# Patient Record
Sex: Female | Born: 1955
Health system: Southern US, Community
[De-identification: ages and names within clinical notes are randomized; demographics above are authoritative.]

## PROBLEM LIST (undated history)

## (undated) DIAGNOSIS — B029 Zoster without complications: Secondary | ICD-10-CM

## (undated) DIAGNOSIS — E782 Mixed hyperlipidemia: Secondary | ICD-10-CM

## (undated) DIAGNOSIS — L0390 Cellulitis, unspecified: Secondary | ICD-10-CM

## (undated) DIAGNOSIS — F5101 Primary insomnia: Secondary | ICD-10-CM

## (undated) DIAGNOSIS — M1990 Unspecified osteoarthritis, unspecified site: Secondary | ICD-10-CM

## (undated) DIAGNOSIS — F419 Anxiety disorder, unspecified: Secondary | ICD-10-CM

## (undated) HISTORY — DX: Primary insomnia: F51.01

## (undated) HISTORY — PX: OTHER SURGICAL HISTORY: SHX169

## (undated) HISTORY — DX: Mixed hyperlipidemia: E78.2

## (undated) HISTORY — DX: Anxiety disorder, unspecified: F41.9

## (undated) HISTORY — PX: LAPAROSCOPIC OVARIAN CYSTECTOMY: SUR786

## (undated) HISTORY — PX: WRIST SURGERY: SHX841

---

## 2014-10-12 DIAGNOSIS — F5102 Adjustment insomnia: Secondary | ICD-10-CM | POA: Insufficient documentation

## 2014-10-12 DIAGNOSIS — F411 Generalized anxiety disorder: Secondary | ICD-10-CM | POA: Insufficient documentation

## 2014-10-13 DIAGNOSIS — E7800 Pure hypercholesterolemia, unspecified: Secondary | ICD-10-CM | POA: Insufficient documentation

## 2016-11-10 DIAGNOSIS — M7989 Other specified soft tissue disorders: Secondary | ICD-10-CM | POA: Diagnosis not present

## 2016-11-10 DIAGNOSIS — M79661 Pain in right lower leg: Secondary | ICD-10-CM | POA: Diagnosis not present

## 2016-11-10 DIAGNOSIS — I82401 Acute embolism and thrombosis of unspecified deep veins of right lower extremity: Secondary | ICD-10-CM | POA: Diagnosis not present

## 2016-11-11 DIAGNOSIS — M7989 Other specified soft tissue disorders: Secondary | ICD-10-CM | POA: Diagnosis not present

## 2016-11-11 DIAGNOSIS — M79661 Pain in right lower leg: Secondary | ICD-10-CM | POA: Diagnosis not present

## 2016-12-30 DIAGNOSIS — M7989 Other specified soft tissue disorders: Secondary | ICD-10-CM | POA: Diagnosis not present

## 2016-12-30 DIAGNOSIS — M2550 Pain in unspecified joint: Secondary | ICD-10-CM | POA: Diagnosis not present

## 2016-12-30 DIAGNOSIS — R5381 Other malaise: Secondary | ICD-10-CM | POA: Diagnosis not present

## 2016-12-30 DIAGNOSIS — R5383 Other fatigue: Secondary | ICD-10-CM | POA: Diagnosis not present

## 2016-12-30 DIAGNOSIS — R600 Localized edema: Secondary | ICD-10-CM | POA: Diagnosis not present

## 2017-01-20 DIAGNOSIS — Z1231 Encounter for screening mammogram for malignant neoplasm of breast: Secondary | ICD-10-CM | POA: Diagnosis not present

## 2017-02-03 DIAGNOSIS — D696 Thrombocytopenia, unspecified: Secondary | ICD-10-CM | POA: Diagnosis not present

## 2017-02-03 DIAGNOSIS — R799 Abnormal finding of blood chemistry, unspecified: Secondary | ICD-10-CM | POA: Diagnosis not present

## 2017-04-26 DIAGNOSIS — M25562 Pain in left knee: Secondary | ICD-10-CM | POA: Diagnosis not present

## 2017-04-26 DIAGNOSIS — R768 Other specified abnormal immunological findings in serum: Secondary | ICD-10-CM | POA: Diagnosis not present

## 2017-04-26 DIAGNOSIS — M25561 Pain in right knee: Secondary | ICD-10-CM | POA: Diagnosis not present

## 2017-04-27 DIAGNOSIS — Z23 Encounter for immunization: Secondary | ICD-10-CM | POA: Diagnosis not present

## 2017-06-25 DIAGNOSIS — J00 Acute nasopharyngitis [common cold]: Secondary | ICD-10-CM | POA: Diagnosis not present

## 2017-07-16 ENCOUNTER — Emergency Department (HOSPITAL_BASED_OUTPATIENT_CLINIC_OR_DEPARTMENT_OTHER)
Admission: EM | Admit: 2017-07-16 | Discharge: 2017-07-16 | Disposition: A | Discharge status: Home / Self Care | Payer: 59 | Attending: Emergency Medicine | Admitting: Emergency Medicine

## 2017-07-16 ENCOUNTER — Emergency Department (HOSPITAL_BASED_OUTPATIENT_CLINIC_OR_DEPARTMENT_OTHER): Payer: 59

## 2017-07-16 ENCOUNTER — Encounter (HOSPITAL_BASED_OUTPATIENT_CLINIC_OR_DEPARTMENT_OTHER): Payer: Self-pay | Admitting: Emergency Medicine

## 2017-07-16 ENCOUNTER — Other Ambulatory Visit: Payer: Self-pay

## 2017-07-16 DIAGNOSIS — J90 Pleural effusion, not elsewhere classified: Secondary | ICD-10-CM

## 2017-07-16 DIAGNOSIS — Z79899 Other long term (current) drug therapy: Secondary | ICD-10-CM | POA: Diagnosis not present

## 2017-07-16 DIAGNOSIS — Z7982 Long term (current) use of aspirin: Secondary | ICD-10-CM | POA: Insufficient documentation

## 2017-07-16 DIAGNOSIS — M549 Dorsalgia, unspecified: Secondary | ICD-10-CM | POA: Diagnosis present

## 2017-07-16 HISTORY — DX: Unspecified osteoarthritis, unspecified site: M19.90

## 2017-07-16 HISTORY — DX: Cellulitis, unspecified: L03.90

## 2017-07-16 HISTORY — DX: Zoster without complications: B02.9

## 2017-07-16 NOTE — Discharge Instructions (Signed)
It was my pleasure taking care of you today!  It is important that you follow up with your primary care doctor for repeat chest x-ray in the next 1-2 weeks to ensure your pleural effusion has resolved.   Return to ER for new or worsening symptoms, any additional concerns.

## 2017-07-16 NOTE — ED Triage Notes (Signed)
Pt c/o LT rib area/torso pain since yesterday; denies injury; describes as pressure

## 2017-07-16 NOTE — ED Provider Notes (Signed)
[place] EMERGENCY DEPARTMENT Provider Note   CSN: [id number] Arrival date & time: 07/16/17  0919     History   Chief Complaint Chief Complaint  Patient presents with  . Back / flank Pain    HPI Sandra Rodriguez is a 61 y.o. female.  The history is provided by the patient and medical records. No language interpreter was used.   Sandra Rodriguez is a  61 y.o. female  with a PMH as listed below who presents to the Emergency Department complaining of left-sided upper back pain for "a few months" which moved to left side and under left breast yesterday. Pain is described as a pressure which gets worse when laying on her right side. No other aggravating factors. Denies alleviating factors.  Denies known injury or increase in activity.  No medications or treatments taken prior to arrival for symptoms. Denies chest pain. Denies shortness of breath, but states that when she takes a deep breath, she will feel "something catch". Last week, she went to the doctor where she tested negative for strep and flu and was given rx for amoxicillin for "head cold". She denies chest congestion or cough. She has not been taking ABX regularly as she has three pills left and should be completed today. She feels as if URI symptoms have improved. No fever, chills, abdominal pain, n/v/d, urinary symptoms.  No recent travel, surgeries or immobilizations.  No hormone use.  No history of DVT, PE or clotting factor disorders.  No history of hypertension, hyperlipidemia, heart disease or diabetes.  No cardiac family history.   Past Medical History:  Diagnosis Date  . Arthritis   . Cellulitis   . Shingles     There are no active problems to display for this patient.   Past Surgical History:  Procedure Laterality Date  . WRIST SURGERY      OB History    No data available       Home Medications    Prior to Admission medications   Medication Sig Start Date End Date Taking? Authorizing Provider    aspirin 81 MG chewable tablet Chew by mouth daily.   Yes [provider]  multivitamin-lutein (OCUVITE-LUTEIN) CAPS capsule Take 1 capsule by mouth daily.   Yes [provider]  Omega-3 Fatty Acids (FISH OIL) 1200 MG CPDR Take by mouth.   Yes [provider]    Family History No family history on file.  Social History Social History   Tobacco Use  . Smoking status: Never Smoker  . Smokeless tobacco: Never Used  Substance Use Topics  . Alcohol use: No    Frequency: Never  . Drug use: No     Allergies   Ibuprofen   Review of Systems Review of Systems  Genitourinary: Negative for dysuria and frequency.  Musculoskeletal: Positive for arthralgias, back pain and myalgias.  Skin: Negative for color change.  All other systems reviewed and are negative.    Physical Exam Updated Vital Signs BP (!) 176/85 (BP Location: Left Arm)   Pulse 94   Temp 97.9 F (36.6 C) (Oral)   Resp 18   Ht 5\' 5"  (1.651 m)   Wt 76.2 kg (168 lb)   SpO2 98%   BMI 27.96 kg/m   Physical Exam  Constitutional: She is oriented to person, place, and time. She appears well-developed and well-nourished. No distress.  HENT:  Head: Normocephalic and atraumatic.  Cardiovascular: Normal rate, regular rhythm and normal heart sounds.  No murmur  heard. Pulmonary/Chest: Effort normal and breath sounds normal. No stridor. No respiratory distress. She has no wheezes.  Breast exam with no masses or nodules appreciated. No overlying skin changes. Tenderness to palpation of left upper lateral side as depicted in image with no overlying skin changes.    Abdominal: Soft. She exhibits no distension. There is no tenderness.  Musculoskeletal: She exhibits no edema.  Neurological: She is alert and oriented to person, place, and time.  Skin: Skin is warm and dry.  Nursing note and vitals reviewed.    ED Treatments / Results  Labs (all labs ordered are listed, but only abnormal results  are displayed) Labs Reviewed - No data to display  EKG  EKG Interpretation  Date/Time:  Friday July 16 2017 10:03:28 EST Ventricular Rate:  92 PR Interval:    QRS Duration: 90 QT Interval:  363 QTC Calculation: 449 R Axis:   16 Text Interpretation:  Sinus rhythm Borderline T abnormalities, anterior leads NO STEMI No old tracing to compare Confirmed by Addison Lank [phone]) on 07/16/2017 10:20:33 AM       Radiology Dg Ribs Unilateral W/chest Left  Result Date: 07/16/2017 CLINICAL DATA:  Left posterior rib pain for few days. No known injury. EXAM: LEFT RIBS AND CHEST - 3+ VIEW COMPARISON:  07/16/2017 FINDINGS: No fracture or other bone lesions are seen involving the ribs. There is no evidence of pneumothorax. There is a small left pleural effusion. There is a trace right pleural effusion. There is left basilar atelectasis. Heart size and mediastinal contours are within normal limits. IMPRESSION: 1.  No acute osseous injury of the left ribs. 2. Small left and trace right pleural effusion with left basilar atelectasis. Electronically Signed   By: Kathreen Devoid   On: 07/16/2017 10:01    Procedures Procedures (including critical care time)  Medications Ordered in ED Medications - No data to display   Initial Impression / Assessment and Plan / ED Course  I have reviewed the triage vital signs and the nursing notes.  Pertinent labs & imaging results that were available during my care of the patient were reviewed by me and considered in my medical decision making (see chart for details).    Sandra Rodriguez is a 61 y.o. female who presents to ED for left sided chest wall pain which began yesterday. Minimal tenderness on exam. Lungs clear to ausculation. Normal cardiac and breast exam. No shortness of breath. EKG reassuring. CXR shows small left pleural effusion with left basilar atelectasis. No PNA. Patient has PCP and agrees to follow up next week for recheck to ensure effusion  resolution. Reasons to return to ER discussed and all questions answered.   Patient discussed with Dr. Leonette Monarch who agrees with treatment plan.   Final Clinical Impressions(s) / ED Diagnoses   Final diagnoses:  Pleural effusion    ED Discharge Orders    None       Ward, Ozella Almond, PA-C 07/16/17 [place], [place], MD 07/17/17 [phone]

## 2017-08-17 DIAGNOSIS — J90 Pleural effusion, not elsewhere classified: Secondary | ICD-10-CM | POA: Diagnosis not present

## 2017-08-17 DIAGNOSIS — R079 Chest pain, unspecified: Secondary | ICD-10-CM | POA: Diagnosis not present

## 2017-09-05 ENCOUNTER — Emergency Department (HOSPITAL_BASED_OUTPATIENT_CLINIC_OR_DEPARTMENT_OTHER)
Admission: EM | Admit: 2017-09-05 | Discharge: 2017-09-05 | Disposition: A | Discharge status: Home / Self Care | Payer: 59 | Attending: Emergency Medicine | Admitting: Emergency Medicine

## 2017-09-05 ENCOUNTER — Other Ambulatory Visit: Payer: Self-pay

## 2017-09-05 ENCOUNTER — Emergency Department (HOSPITAL_BASED_OUTPATIENT_CLINIC_OR_DEPARTMENT_OTHER): Payer: 59

## 2017-09-05 ENCOUNTER — Encounter (HOSPITAL_BASED_OUTPATIENT_CLINIC_OR_DEPARTMENT_OTHER): Payer: Self-pay | Admitting: Emergency Medicine

## 2017-09-05 DIAGNOSIS — R42 Dizziness and giddiness: Secondary | ICD-10-CM | POA: Insufficient documentation

## 2017-09-05 DIAGNOSIS — Z79899 Other long term (current) drug therapy: Secondary | ICD-10-CM | POA: Insufficient documentation

## 2017-09-05 DIAGNOSIS — R002 Palpitations: Secondary | ICD-10-CM | POA: Insufficient documentation

## 2017-09-05 DIAGNOSIS — Z7982 Long term (current) use of aspirin: Secondary | ICD-10-CM | POA: Insufficient documentation

## 2017-09-05 DIAGNOSIS — R55 Syncope and collapse: Secondary | ICD-10-CM | POA: Insufficient documentation

## 2017-09-05 DIAGNOSIS — R079 Chest pain, unspecified: Secondary | ICD-10-CM | POA: Diagnosis not present

## 2017-09-05 LAB — CBC WITH DIFFERENTIAL/PLATELET
Basophils Absolute: 0 10*3/uL (ref 0.0–0.1)
Basophils Relative: 0 %
Eosinophils Absolute: 0 10*3/uL (ref 0.0–0.7)
Eosinophils Relative: 0 %
HCT: 41.8 % (ref 36.0–46.0)
HEMOGLOBIN: 14.1 g/dL (ref 12.0–15.0)
LYMPHS PCT: 47 %
Lymphs Abs: 5.3 10*3/uL — ABNORMAL HIGH (ref 0.7–4.0)
MCH: 32.2 pg (ref 26.0–34.0)
MCHC: 33.7 g/dL (ref 30.0–36.0)
MCV: 95.4 fL (ref 78.0–100.0)
MONOS PCT: 7 %
Monocytes Absolute: 0.8 10*3/uL (ref 0.1–1.0)
NEUTROS PCT: 46 %
Neutro Abs: 5.3 10*3/uL (ref 1.7–7.7)
Platelets: 399 10*3/uL (ref 150–400)
RBC: 4.38 MIL/uL (ref 3.87–5.11)
RDW: 14.8 % (ref 11.5–15.5)
WBC: 11.5 10*3/uL — AB (ref 4.0–10.5)

## 2017-09-05 LAB — COMPREHENSIVE METABOLIC PANEL
ALK PHOS: 127 U/L — AB (ref 38–126)
ALT: 18 U/L (ref 14–54)
AST: 19 U/L (ref 15–41)
Albumin: 3.6 g/dL (ref 3.5–5.0)
Anion gap: 8 (ref 5–15)
BILIRUBIN TOTAL: 0.2 mg/dL — AB (ref 0.3–1.2)
BUN: 15 mg/dL (ref 6–20)
CALCIUM: 9 mg/dL (ref 8.9–10.3)
CO2: 25 mmol/L (ref 22–32)
CREATININE: 0.92 mg/dL (ref 0.44–1.00)
Chloride: 106 mmol/L (ref 101–111)
GFR calc Af Amer: >60 mL/min (ref >60)
GFR calc non Af Amer: >60 mL/min (ref >60)
Glucose, Bld: 97 mg/dL (ref 65–99)
Potassium: 3.7 mmol/L (ref 3.5–5.1)
Sodium: 139 mmol/L (ref 135–145)
TOTAL PROTEIN: 7.5 g/dL (ref 6.5–8.1)

## 2017-09-05 LAB — TROPONIN I: Troponin I: <0.03 ng/mL (ref <0.03)

## 2017-09-05 NOTE — ED Notes (Signed)
Pt on monitor 

## 2017-09-05 NOTE — ED Triage Notes (Signed)
"   Thursday at work I was lightheaded and this am I was eating breakfast and felt lightheaded" Palpations at night for months at times. Has not see Cards MD

## 2017-09-05 NOTE — ED Provider Notes (Signed)
[facility] EMERGENCY DEPARTMENT Provider Note   CSN: [id number] Arrival date & time: 09/05/17  1126     History   Chief Complaint No chief complaint on file.   HPI Sandra Rodriguez is a 62 y.o. female.  HPI Patient presents with lightheadedness and near syncope.  Had an episode today.  Has also had more palpitations.  Had been seen in the ER and had a small pleural effusion.  Has not had cardiology follow-up yet.  Has had episodes where she has passed out twice also.  Does not initially feel her heart go faster slow although today after feeling lightheaded someone at work checked her pulse and it was going quickly.  No swelling in her legs.  No fevers.  No weight loss.  No cough.  No chest pain. Past Medical History:  Diagnosis Date  . Arthritis   . Cellulitis   . Shingles     There are no active problems to display for this patient.   Past Surgical History:  Procedure Laterality Date  . WRIST SURGERY      OB History    No data available       Home Medications    Prior to Admission medications   Medication Sig Start Date End Date Taking? Authorizing Provider  aspirin 81 MG chewable tablet Chew by mouth daily.   Yes [provider]  multivitamin-lutein (OCUVITE-LUTEIN) CAPS capsule Take 1 capsule by mouth daily.   Yes [provider]  Omega-3 Fatty Acids (FISH OIL) 1200 MG CPDR Take by mouth.   Yes [provider]    Family History No family history on file.  Social History Social History   Tobacco Use  . Smoking status: Never Smoker  . Smokeless tobacco: Never Used  Substance Use Topics  . Alcohol use: No    Frequency: Never  . Drug use: No     Allergies   Ibuprofen and Meloxicam   Review of Systems Review of Systems  Constitutional: Negative for appetite change.  HENT: Negative for congestion.   Eyes: Negative for redness.  Respiratory: Negative for cough and shortness of breath.   Gastrointestinal: Negative  for abdominal pain and anal bleeding.  Endocrine: Negative for polyuria.  Genitourinary: Negative for dyspareunia and dysuria.  Musculoskeletal: Negative for back pain.  Neurological: Positive for light-headedness.  Hematological: Negative for adenopathy.  Psychiatric/Behavioral: Negative for confusion.     Physical Exam Updated Vital Signs BP (!) 128/94 (BP Location: Left Arm)   Pulse 91   Temp 98.4 F (36.9 C) (Oral)   Resp 19   Ht 5\' 5"  (1.651 m)   Wt 72.6 kg (160 lb)   SpO2 97%   BMI 26.63 kg/m   Physical Exam  Constitutional: She appears well-developed.  HENT:  Head: Atraumatic.  Neck: Neck supple. No thyromegaly present.  Cardiovascular: Normal rate.  Pulmonary/Chest: Effort normal.  Abdominal: Soft. There is no tenderness.  Musculoskeletal: She exhibits no edema.  Neurological: She is alert.  Skin: Skin is warm. Capillary refill takes less than 2 seconds.  Psychiatric: She has a normal mood and affect.     ED Treatments / Results  Labs (all labs ordered are listed, but only abnormal results are displayed) Labs Reviewed  CBC WITH DIFFERENTIAL/PLATELET - Abnormal; Notable for the following components:      Result Value   WBC 11.5 (*)    Lymphs Abs 5.3 (*)    All other components within normal limits  COMPREHENSIVE METABOLIC PANEL -  Abnormal; Notable for the following components:   Alkaline Phosphatase 127 (*)    Total Bilirubin 0.2 (*)    All other components within normal limits  TROPONIN I    EKG  EKG Interpretation  Date/Time:  Sunday September 05 2017 11:38:10 EST Ventricular Rate:  96 PR Interval:    QRS Duration: 86 QT Interval:  354 QTC Calculation: 448 R Axis:   28 Text Interpretation:  Sinus rhythm Low voltage, precordial leads Borderline T abnormalities, anterior leads Confirmed by Davonna Belling [phone]) on 09/05/2017 12:15:52 PM       Radiology Dg Chest 2 View  Result Date: 09/05/2017 CLINICAL DATA:  Left chest pain for over 1  month. EXAM: CHEST  2 VIEW COMPARISON:  Single view of the chest and plain films left ribs 07/16/2017. FINDINGS: Small left pleural effusion and linear opacities in the left lung base compatible with atelectasis or scar are unchanged. No right effusion. The right lung is clear. Heart size is upper normal. No pneumothorax. No acute bony abnormality is seen. IMPRESSION: No change in a small left pleural effusion and linear atelectasis or scar in the left lung base. No new abnormality. Electronically Signed   By: Inge Rise M.D.   On: 09/05/2017 12:53    Procedures Procedures (including critical care time)  Medications Ordered in ED Medications - No data to display   Initial Impression / Assessment and Plan / ED Course  I have reviewed the triage vital signs and the nursing notes.  Pertinent labs & imaging results that were available during my care of the patient were reviewed by me and considered in my medical decision making (see chart for details).     Patient with headedness.  Reportedly had fast heart rate at the time 2.  Has had palpitations at night.  Has plans to see cardiology but has not seen him.  Labs here are reassuring.  X-ray stable.  Not tachycardic at this time.  Will discharge home to follow-up with PCP.  Final Clinical Impressions(s) / ED Diagnoses   Final diagnoses:  Near syncope    ED Discharge Orders    None       Davonna Belling, MD 09/05/17 1445

## 2017-09-14 ENCOUNTER — Encounter: Payer: Self-pay | Admitting: Adult Health

## 2017-09-14 ENCOUNTER — Ambulatory Visit: Payer: 59 | Admitting: Adult Health

## 2017-09-14 ENCOUNTER — Encounter: Payer: Self-pay | Admitting: Cardiovascular Disease

## 2017-09-14 VITALS — BP 132/86 | HR 94 | Ht 65.0 in | Wt 168.2 lb

## 2017-09-14 DIAGNOSIS — Z79899 Other long term (current) drug therapy: Secondary | ICD-10-CM

## 2017-09-14 DIAGNOSIS — R002 Palpitations: Secondary | ICD-10-CM

## 2017-09-14 DIAGNOSIS — R55 Syncope and collapse: Secondary | ICD-10-CM

## 2017-09-14 NOTE — Progress Notes (Signed)
Cardiology Office Note   Date:  09/14/2017   ID:  Sandra Rodriguez, DOB [birth year], MRN [id number]  PCP:  [facility]  Cardiologist: New patient-Dr. Martinique (DOD at [facility] today) he Chief Complaint  Patient presents with  . Hospitalization Follow-up  . Palpitations     History of Present Illness: Sandra Rodriguez is a 62 y.o. female who presents for post ER visit in the setting of lightheadedness and near syncope.  She also complained of palpitations.  On ER visit on 09/05/2017 the patient had a small pleural effusion noted on chest x-ray.  The patient was released from the emergency room on 09/05/2017 and is here for follow-up and establishment with cardiology.  She has continued to have dizziness, some palpitations. She sometimes felt lightheaded. She denies chest pain. She has a strong family history of CAD in her mother and brother who required CABG. Maternal GF also had MI;s and required CABG.   She works in a optometrist office, is on her feet a lot. She feels some dizziness when this occurs. She has syncopal episode while in [place] office last month.   Past Medical History:  Diagnosis Date  . Arthritis   . Cellulitis   . Shingles     Past Surgical History:  Procedure Laterality Date  . WRIST SURGERY       Current Outpatient Medications  Medication Sig Dispense Refill  . aspirin 81 MG chewable tablet Chew by mouth daily.    . multivitamin-lutein (OCUVITE-LUTEIN) CAPS capsule Take 1 capsule by mouth daily.    . Omega-3 Fatty Acids (FISH OIL) 1200 MG CPDR Take by mouth.     No current facility-administered medications for this visit.     Allergies:   Ibuprofen and Meloxicam    Social History:  The patient  reports that  has never smoked. she has never used smokeless tobacco. She reports that she does not drink alcohol or use drugs.   Family History:  The patient's family history includes Breast cancer in her sister; Diabetes in her brother; Heart attack in her  brother; Heart disease in her mother; Lung disease in her father.    ROS: All other systems are reviewed and negative. Unless otherwise mentioned in H&P    PHYSICAL EXAM: VS:  BP 132/86   Pulse 94   Ht 5\' 5"  (1.651 m)   Wt 168 lb 3.2 oz (76.3 kg)   SpO2 96%   BMI 27.99 kg/m  , BMI Body mass index is 27.99 kg/m. GEN: Well nourished, well developed, in no acute distress  HEENT: normal  Neck: no JVD, carotid bruits, or masses Cardiac: RRR; no murmurs, rubs, or gallops,no edema  Respiratory:  clear to auscultation bilaterally, normal work of breathing GI: soft, nontender, nondistended, + BS MS: no deformity or atrophy  Skin: warm and dry, no rash Neuro:  Strength and sensation are intact Psych: euthymic mood, full affect   EKG:  Not completed today. ER EKG, normal sinus rhythm with nonspecific T-wave abnormalities.   Recent Labs: 09/05/2017: ALT 18; BUN 15; Creatinine, Ser 0.92; Hemoglobin 14.1; Platelets 399; Potassium 3.7; Sodium 139    Lipid Panel No results found for: CHOL, TRIG, HDL, CHOLHDL, VLDL, LDLCALC, LDLDIRECT    Wt Readings from Last 3 Encounters:  09/14/17 168 lb 3.2 oz (76.3 kg)  09/05/17 160 lb (72.6 kg)  07/16/17 168 lb (76.2 kg)      ASSESSMENT AND PLAN:  1. Syncopal episodes with dizziness: We'll plan echocardiogram for structural heart  disease, place a 30 day  cardiac monitor to evaluate for arrhythmias morphology and duration.  2. Strong family history of coronary artery disease: Lexiscan Myoview for diagnostic prognostic purposes. Due to history of syncope and dizziness, well do Lexiscan versus having her walk on treadmill for safety.  3. Unknown lipid status: Check fasting lipids and LFTs.  Current medicines are reviewed at length with the patient today.    Labs/ tests ordered today include: Fasting lipids LFTs, echocardiogram Lexiscan Myoview   I have discussed this patient with Dr. Martinique, Sandra Rodriguez at [facility] office today, who is in agreement  with my assessment and plan. She will follow-up with him to discuss test results, so that he can meet her and have her become appointed with him as well.   Phill Myron. West Pugh, ANP, AACC     09/14/2017 12:42 PM    [facility] [address] [place], [place] [address] Phone: [phone]; Fax: [phone]

## 2017-09-14 NOTE — Patient Instructions (Addendum)
Medication Instructions:  NO CHANGES-Your physician recommends that you continue on your current medications as directed. Please refer to the Current Medication list given to you today.  If you need a refill on your cardiac medications before your next appointment, please call your pharmacy.  Labwork: FLP, LFT AND MAG PLEASE RETURN FASTING HERE IN OUR OFFICE AT [facility]  Testing/Procedures: Your physician has requested that you have a lexiscan myoview.A cardiac stress test is a cardiological test that measures the heart's ability to respond to external stress in a controlled clinical environment. The stress response is induced byintravenous pharmacological stimulation. For further information please visit [employer]. Please follow instruction sheet, as given.  Your physician has recommended that you wear an event monitor. Event monitors are medical devices that record the heart's electrical activity. Doctors most often [place] these monitors to diagnose arrhythmias. Arrhythmias are problems with the speed or rhythm of the heartbeat. The monitor is a small, portable device. You can wear one while you do your normal daily activities. This is usually used to diagnose what is causing palpitations/syncope (passing out).  Echocardiogram - Your physician has requested that you have an echocardiogram. Echocardiography is a painless test that uses sound waves to create images of your heart. It provides your doctor with information about the size and shape of your heart and how well your heart's chambers and valves are working. This procedure takes approximately one hour. There are no restrictions for this procedure. This will be performed at our [facility] location - [address], [address].   Follow-Up: Your physician wants you to follow-up in: 6 WEEKS WITH DR Martinique.    Thank you for choosing [facility] at [facility]!!

## 2017-09-17 DIAGNOSIS — R002 Palpitations: Secondary | ICD-10-CM | POA: Diagnosis not present

## 2017-09-17 DIAGNOSIS — R55 Syncope and collapse: Secondary | ICD-10-CM | POA: Diagnosis not present

## 2017-09-17 DIAGNOSIS — Z79899 Other long term (current) drug therapy: Secondary | ICD-10-CM | POA: Diagnosis not present

## 2017-09-18 LAB — HEPATIC FUNCTION PANEL
ALBUMIN: 4.3 g/dL (ref 3.6–4.8)
ALK PHOS: 136 IU/L — AB (ref 39–117)
ALT: 14 IU/L (ref 0–32)
AST: 18 IU/L (ref 0–40)
BILIRUBIN TOTAL: 0.3 mg/dL (ref 0.0–1.2)
BILIRUBIN, DIRECT: 0.07 mg/dL (ref 0.00–0.40)
Total Protein: 7.5 g/dL (ref 6.0–8.5)

## 2017-09-18 LAB — LIPID PANEL
CHOLESTEROL TOTAL: 196 mg/dL (ref 100–199)
Chol/HDL Ratio: 3.9 ratio (ref 0.0–4.4)
HDL: 50 mg/dL (ref >39)
LDL CALC: 129 mg/dL — AB (ref 0–99)
Triglycerides: 83 mg/dL (ref 0–149)
VLDL Cholesterol Cal: 17 mg/dL (ref 5–40)

## 2017-09-18 LAB — MAGNESIUM: Magnesium: 2.1 mg/dL (ref 1.6–2.3)

## 2017-09-20 ENCOUNTER — Other Ambulatory Visit: Payer: Self-pay

## 2017-09-20 DIAGNOSIS — E785 Hyperlipidemia, unspecified: Secondary | ICD-10-CM

## 2017-09-20 MED ORDER — EZETIMIBE 10 MG PO TABS: 10.0000 mg | ORAL_TABLET | Freq: Every day | ORAL | 0 refills | Status: DC

## 2017-09-22 ENCOUNTER — Telehealth (HOSPITAL_COMMUNITY): Payer: Self-pay

## 2017-09-22 ENCOUNTER — Ambulatory Visit (HOSPITAL_COMMUNITY): Payer: 59 | Attending: Cardiovascular Disease

## 2017-09-22 ENCOUNTER — Ambulatory Visit (INDEPENDENT_AMBULATORY_CARE_PROVIDER_SITE_OTHER): Payer: 59

## 2017-09-22 DIAGNOSIS — R55 Syncope and collapse: Secondary | ICD-10-CM

## 2017-09-22 DIAGNOSIS — R002 Palpitations: Secondary | ICD-10-CM

## 2017-09-22 DIAGNOSIS — Z79899 Other long term (current) drug therapy: Secondary | ICD-10-CM | POA: Diagnosis not present

## 2017-09-22 DIAGNOSIS — I371 Nonrheumatic pulmonary valve insufficiency: Secondary | ICD-10-CM | POA: Diagnosis not present

## 2017-09-22 DIAGNOSIS — I071 Rheumatic tricuspid insufficiency: Secondary | ICD-10-CM | POA: Diagnosis not present

## 2017-09-22 NOTE — Telephone Encounter (Signed)
Encounter complete. 

## 2017-09-23 ENCOUNTER — Telehealth (HOSPITAL_COMMUNITY): Payer: Self-pay

## 2017-09-23 NOTE — Telephone Encounter (Signed)
Encounter complete. 

## 2017-09-24 ENCOUNTER — Ambulatory Visit (HOSPITAL_COMMUNITY)
Admission: RE | Admit: 2017-09-24 | Discharge: 2017-09-24 | Disposition: A | Discharge status: Home / Self Care | Payer: 59 | Source: Ambulatory Visit | Attending: Cardiology | Admitting: Cardiology

## 2017-09-24 DIAGNOSIS — R002 Palpitations: Secondary | ICD-10-CM | POA: Insufficient documentation

## 2017-09-24 DIAGNOSIS — R079 Chest pain, unspecified: Secondary | ICD-10-CM | POA: Diagnosis not present

## 2017-09-24 DIAGNOSIS — Z8249 Family history of ischemic heart disease and other diseases of the circulatory system: Secondary | ICD-10-CM | POA: Insufficient documentation

## 2017-09-24 DIAGNOSIS — I10 Essential (primary) hypertension: Secondary | ICD-10-CM | POA: Diagnosis not present

## 2017-09-24 DIAGNOSIS — R42 Dizziness and giddiness: Secondary | ICD-10-CM | POA: Diagnosis not present

## 2017-09-24 DIAGNOSIS — R55 Syncope and collapse: Secondary | ICD-10-CM | POA: Diagnosis not present

## 2017-09-24 LAB — MYOCARDIAL PERFUSION IMAGING
CHL CUP NUCLEAR SDS: 0
CHL CUP NUCLEAR SRS: 0
LV dias vol: 72 mL (ref 46–106)
LVSYSVOL: 18 mL
Peak HR: 142 {beats}/min
Rest HR: 84 {beats}/min
SSS: 0
TID: 1.11

## 2017-09-24 MED ORDER — TECHNETIUM TC 99M TETROFOSMIN IV KIT
30.0000 | PACK | Freq: Once | INTRAVENOUS | Status: AC | PRN
Start: 2017-09-24 — End: 2017-09-24
  Administered 2017-09-24: 30 via INTRAVENOUS
  Filled 2017-09-24: qty 30

## 2017-09-24 MED ORDER — TECHNETIUM TC 99M TETROFOSMIN IV KIT
10.2000 | PACK | Freq: Once | INTRAVENOUS | Status: AC | PRN
  Administered 2017-09-24: 10.2 via INTRAVENOUS
  Filled 2017-09-24: qty 11

## 2017-09-24 MED ORDER — REGADENOSON 0.4 MG/5ML IV SOLN
0.4000 mg | Freq: Once | INTRAVENOUS | Status: AC
  Administered 2017-09-24: 0.4 mg via INTRAVENOUS

## 2017-09-27 ENCOUNTER — Telehealth: Payer: Self-pay | Admitting: Adult Health

## 2017-09-27 NOTE — Telephone Encounter (Signed)
New Message   Patient is calling back about echocardiogram results. She has some additional questions. Please call to discuss.

## 2017-09-27 NOTE — Telephone Encounter (Signed)
Spoke with pt, questions regarding echo answered. 

## 2017-10-07 ENCOUNTER — Telehealth: Payer: Self-pay | Admitting: Adult Health

## 2017-10-07 NOTE — Telephone Encounter (Signed)
New Message   Patient is currently wearing a holter monitor until 10/22/2017. She is going out of town on 10/14/2017. She is wanting to know can she download and return prior to her leaving. Please call to discuss.

## 2017-10-07 NOTE — Telephone Encounter (Signed)
Spoke with pt, questions regarding monitor and leaving town answered. Aware she will be charged the full amount if she turns it in early. She will decide what she feels is best for her.

## 2017-10-08 DIAGNOSIS — Z23 Encounter for immunization: Secondary | ICD-10-CM | POA: Diagnosis not present

## 2017-10-20 ENCOUNTER — Telehealth: Payer: Self-pay | Admitting: Cardiology

## 2017-10-20 NOTE — Telephone Encounter (Signed)
Received incoming records from [facility] for upcoming appointment on 11/01/17 @ 8:40am with Dr. Martinique. Records located in Medical Records. 10/20/17 ab

## 2017-10-22 ENCOUNTER — Telehealth: Payer: Self-pay

## 2017-10-22 ENCOUNTER — Encounter: Payer: Self-pay | Admitting: Cardiology

## 2017-10-22 NOTE — Telephone Encounter (Signed)
Serious notification received for patient monitor day 30 of 30. Pt was in NSR with 7 beats of V-Tach on 3/14. Called pt left VM to call back, also called but did not leave a message with pt emergency contact. Dr. Martinique in office today forwarding to him for review and will try to call pt back as instructed.

## 2017-10-22 NOTE — Telephone Encounter (Signed)
Dr.Jordan reviewed strips.Spoke to patient she was unaware of 7 beat run of V tach on 10/21/17 at 5:59.Dr.Jordan advised no change.

## 2017-10-22 NOTE — Telephone Encounter (Signed)
Will review strips when available. With normal Echo and Myoview generally NSVT is still benign.  Morad Tal Martinique MD, [facility]

## 2017-10-30 NOTE — Progress Notes (Signed)
Cardiology Office Note   Date:  11/01/2017   ID:  Sandra Rodriguez, DOB 1955/11/09, MRN [id number]  PCP:  [facility]  Cardiologist: Dr. Martinique  Chief Complaint  Patient presents with  . Follow-up    6 weeks  . Shortness of Breath     History of Present Illness: Sandra Rodriguez is a 62 y.o. female who presents for follow up of lightheadedness and near syncope.  She also complained of palpitations.  She was seen in ER on 09/05/2017. Later seen in follow up with Jory Sims NP on 09/14/17. Evaluation included a Lexiscan  Myoview which was normal. Echo was normal. 30 day event monitor showed NSR with one 7 beat run of NSVT without symptoms.   She works in a optometrist office, is on her feet a lot. She noted dizziness on one occasion at work.  She did have a  syncopal episode while in [place] office last month. They were talking to her about surgery on her dog's ear and she passed out. No other syncope.  Past Medical History:  Diagnosis Date  . Arthritis   . Cellulitis   . Shingles     Past Surgical History:  Procedure Laterality Date  . WRIST SURGERY       Current Outpatient Medications  Medication Sig Dispense Refill  . ezetimibe (ZETIA) 10 MG tablet Take 1 tablet (10 mg total) by mouth daily. 90 tablet 0  . multivitamin-lutein (OCUVITE-LUTEIN) CAPS capsule Take 1 capsule by mouth daily.    . Omega-3 Fatty Acids (FISH OIL) 1200 MG CPDR Take by mouth.     No current facility-administered medications for this visit.     Allergies:   Ibuprofen and Meloxicam    Social History:  The patient  reports that she has never smoked. She has never used smokeless tobacco. She reports that she does not drink alcohol or use drugs.   Family History:  The patient's family history includes Breast cancer in her sister; Diabetes in her brother; Heart attack in her brother; Heart disease in her mother; Lung disease in her father.    ROS: All other systems are reviewed and negative. Unless  otherwise mentioned in H&P    PHYSICAL EXAM: VS:  BP 110/78   Pulse 78   Ht 5\' 5"  (1.651 m)   Wt 169 lb (76.7 kg)   BMI 28.12 kg/m  , BMI Body mass index is 28.12 kg/m. GENERAL:  Well appearing HEENT:  PERRL, EOMI, sclera are clear. Oropharynx is clear. NECK:  No jugular venous distention, carotid upstroke brisk and symmetric, no bruits, no thyromegaly or adenopathy LUNGS:  Clear to auscultation bilaterally CHEST:  Unremarkable HEART:  RRR,  PMI not displaced or sustained,S1 and S2 within normal limits, no S3, no S4: no clicks, no rubs, no murmurs ABD:  Soft, nontender. BS +, no masses or bruits. No hepatomegaly, no splenomegaly EXT:  2 + pulses throughout, no edema, no cyanosis no clubbing SKIN:  Warm and dry.  No rashes NEURO:  Alert and oriented x 3. Cranial nerves II through XII intact. PSYCH:  Cognitively intact   EKG:  Not completed today.    Recent Labs: 09/05/2017: BUN 15; Creatinine, Ser 0.92; Hemoglobin 14.1; Platelets 399; Potassium 3.7; Sodium 139 09/17/2017: ALT 14; Magnesium 2.1    Lipid Panel    Component Value Date/Time   CHOL 196 09/17/2017 0858   TRIG 83 09/17/2017 0858   HDL 50 09/17/2017 0858   CHOLHDL 3.9 09/17/2017 0858  LDLCALC 129 (H) 09/17/2017 0858      Wt Readings from Last 3 Encounters:  11/01/17 169 lb (76.7 kg)  09/24/17 168 lb (76.2 kg)  09/14/17 168 lb 3.2 oz (76.3 kg)    Echo 09/22/17: Study Conclusions  - Left ventricle: The cavity size was normal. Systolic function was   normal. Wall motion was normal; there were no regional wall   motion abnormalities. Left ventricular diastolic function   parameters were normal. - Left atrium: The atrium was mildly dilated. - Atrial septum: No defect or patent foramen ovale was identified  Event monitor 09/22/17: Study Highlights    Normal sinus rhythm  One 7 beat run of NSVT asymptomatic    Myoview 09/22/17: Study Highlights     Nuclear stress EF: 75%.  There was no ST segment  deviation noted during stress.  No T wave inversion was noted during stress.  This is a low risk study.   Normal perfusion. LVEF 75% with normal wall motion. This is a low risk study.      ASSESSMENT AND PLAN:  1. Syncopal episodes with dizziness: based on work up I feel this was a vasovagal episode. No further work up needed. Echo and Myoview were normal. Event monitor showed only 7 beat run NSVT without symptoms. Reassured her that this is benign. Will follow up prn.  2. Strong family history of coronary artery disease: Lexiscan Myoview was normal.   3. hypercholesterolemia  Sandra Rodriguez Martinique MD, [facility]

## 2017-11-01 ENCOUNTER — Encounter: Payer: Self-pay | Admitting: Cardiology

## 2017-11-01 ENCOUNTER — Ambulatory Visit: Payer: 59 | Admitting: Cardiology

## 2017-11-01 VITALS — BP 110/78 | HR 78 | Ht 65.0 in | Wt 169.0 lb

## 2017-11-01 DIAGNOSIS — R55 Syncope and collapse: Secondary | ICD-10-CM | POA: Diagnosis not present

## 2017-11-01 DIAGNOSIS — R002 Palpitations: Secondary | ICD-10-CM | POA: Diagnosis not present

## 2017-11-01 NOTE — Patient Instructions (Signed)
Eat healthy and stay active  Follow up prn.

## 2017-11-22 DIAGNOSIS — J00 Acute nasopharyngitis [common cold]: Secondary | ICD-10-CM | POA: Diagnosis not present

## 2017-12-16 ENCOUNTER — Other Ambulatory Visit: Payer: Self-pay | Admitting: Adult Health

## 2017-12-16 DIAGNOSIS — E785 Hyperlipidemia, unspecified: Secondary | ICD-10-CM

## 2017-12-16 NOTE — Telephone Encounter (Signed)
Rx has been sent to the pharmacy electronically. ° °

## 2018-01-13 DIAGNOSIS — F5101 Primary insomnia: Secondary | ICD-10-CM | POA: Diagnosis not present

## 2018-01-13 DIAGNOSIS — R1012 Left upper quadrant pain: Secondary | ICD-10-CM | POA: Diagnosis not present

## 2018-01-13 DIAGNOSIS — E782 Mixed hyperlipidemia: Secondary | ICD-10-CM | POA: Diagnosis not present

## 2018-01-19 DIAGNOSIS — R1012 Left upper quadrant pain: Secondary | ICD-10-CM | POA: Diagnosis not present

## 2018-03-02 DIAGNOSIS — L578 Other skin changes due to chronic exposure to nonionizing radiation: Secondary | ICD-10-CM | POA: Diagnosis not present

## 2018-03-02 DIAGNOSIS — D225 Melanocytic nevi of trunk: Secondary | ICD-10-CM | POA: Diagnosis not present

## 2018-03-02 DIAGNOSIS — L821 Other seborrheic keratosis: Secondary | ICD-10-CM | POA: Diagnosis not present

## 2018-03-02 DIAGNOSIS — L820 Inflamed seborrheic keratosis: Secondary | ICD-10-CM | POA: Diagnosis not present

## 2018-03-23 DIAGNOSIS — Z1231 Encounter for screening mammogram for malignant neoplasm of breast: Secondary | ICD-10-CM | POA: Diagnosis not present

## 2018-03-23 DIAGNOSIS — R9389 Abnormal findings on diagnostic imaging of other specified body structures: Secondary | ICD-10-CM | POA: Diagnosis not present

## 2018-03-23 DIAGNOSIS — R1900 Intra-abdominal and pelvic swelling, mass and lump, unspecified site: Secondary | ICD-10-CM | POA: Diagnosis not present

## 2018-03-23 DIAGNOSIS — D219 Benign neoplasm of connective and other soft tissue, unspecified: Secondary | ICD-10-CM | POA: Diagnosis not present

## 2018-04-15 DIAGNOSIS — D219 Benign neoplasm of connective and other soft tissue, unspecified: Secondary | ICD-10-CM | POA: Diagnosis not present

## 2018-04-15 DIAGNOSIS — N840 Polyp of corpus uteri: Secondary | ICD-10-CM | POA: Diagnosis not present

## 2018-04-25 DIAGNOSIS — R9389 Abnormal findings on diagnostic imaging of other specified body structures: Secondary | ICD-10-CM | POA: Diagnosis not present

## 2018-04-25 DIAGNOSIS — N9489 Other specified conditions associated with female genital organs and menstrual cycle: Secondary | ICD-10-CM | POA: Diagnosis not present

## 2018-04-29 DIAGNOSIS — R9389 Abnormal findings on diagnostic imaging of other specified body structures: Secondary | ICD-10-CM | POA: Diagnosis not present

## 2018-04-29 DIAGNOSIS — N840 Polyp of corpus uteri: Secondary | ICD-10-CM | POA: Diagnosis not present

## 2018-04-29 DIAGNOSIS — N939 Abnormal uterine and vaginal bleeding, unspecified: Secondary | ICD-10-CM | POA: Diagnosis not present

## 2018-04-29 DIAGNOSIS — N9489 Other specified conditions associated with female genital organs and menstrual cycle: Secondary | ICD-10-CM | POA: Diagnosis not present

## 2018-07-18 DIAGNOSIS — Z23 Encounter for immunization: Secondary | ICD-10-CM | POA: Diagnosis not present

## 2018-08-12 DIAGNOSIS — L0231 Cutaneous abscess of buttock: Secondary | ICD-10-CM | POA: Diagnosis not present

## 2019-01-03 DIAGNOSIS — F5101 Primary insomnia: Secondary | ICD-10-CM | POA: Diagnosis not present

## 2019-01-03 DIAGNOSIS — E782 Mixed hyperlipidemia: Secondary | ICD-10-CM | POA: Diagnosis not present

## 2019-08-22 ENCOUNTER — Encounter: Payer: Self-pay | Admitting: Critical Care Medicine

## 2019-08-22 ENCOUNTER — Ambulatory Visit: Payer: 59 | Admitting: Critical Care Medicine

## 2019-08-22 ENCOUNTER — Other Ambulatory Visit: Payer: Self-pay

## 2019-08-22 VITALS — BP 132/70 | HR 80 | Temp 98.0°F | Ht 63.0 in | Wt 173.0 lb

## 2019-08-22 DIAGNOSIS — R0782 Intercostal pain: Secondary | ICD-10-CM

## 2019-08-22 DIAGNOSIS — R9389 Abnormal findings on diagnostic imaging of other specified body structures: Secondary | ICD-10-CM

## 2019-08-22 DIAGNOSIS — R0602 Shortness of breath: Secondary | ICD-10-CM

## 2019-08-22 NOTE — Patient Instructions (Addendum)
Thank you for visiting Dr. [name] at [facility]. We recommend the following: Orders Placed This Encounter  Procedures  . CT Chest Wo Contrast   Orders Placed This Encounter  Procedures  . CT Chest Wo Contrast    Standing Status:   Future    Standing Expiration Date:   10/19/2020    Scheduling Instructions:     Chest CT w/ follow up appointment in next 2 weeks.    Order Specific Question:   ** REASON FOR EXAM (FREE TEXT)    Answer:   left side pain, history of pleural effusion on left    Order Specific Question:   Preferred imaging location?    Answer:   [facility] Specific Question:   Radiology Contrast Protocol - do NOT remove file path    Answer:   [email or web address]     Return in about 2 weeks (around 09/05/2019).    Please do your part to reduce the spread of COVID-19.

## 2019-08-22 NOTE — Progress Notes (Signed)
Synopsis: Referred in January 2021 for DOE by [facility].  Subjective:   PATIENT ID: Sandra Rodriguez GENDER: female DOB: 12-18-1955, MRN: [id number]  Chief Complaint  Patient presents with  . Consult    Patient is here for stabbing pain in left side under breast for last 3 years that she notices more when she rolls over onto that side. Patient has shortness of breath with exertion    Sandra Rodriguez is a 64 year old woman who presents for evaluation of chronic dyspnea on exertion and left-sided chest pain that is been persistent for about 3 years.  It has not worsened over that time, but remained stable.  These began around the same time.  She does not have limitations in her daily activities, but notices the dyspnea when walking up a hill.  She never gets short of breath at rest.  She denies cough, sputum production, hemoptysis, wheezing.  She has no personal or family history of lung or rheumatologic disease.  She has never smoked or vape.  She works as an Therapist, music.  She has never had occupational dust exposures.  She describes the chest pain as sharp, quick (short lasting) pain that occurs suddenly when she rolls over at night coughs, hiccups, or sneezes.  It never happens at rest.  She had shingles in the past, but this was in her inguinal and hip area, not overlying her chest.  She has a history of a pneumonia several years ago following her wrist surgery, but had resolution with several courses of antibiotics.  She has never had trauma to her left chest, chest tubes, or rib fractures.  She has had occasional knee swelling in the past, but was evaluated by rheumatology and not felt to have rheumatologic disease.     Past Medical History:  Diagnosis Date  . Arthritis   . Cellulitis   . Shingles      Family History  Problem Relation Age of Onset  . Heart disease Mother   . Neuromuscular disorder Father   . Breast cancer Sister   . Heart attack Brother   . Diabetes Brother      Past Surgical History:  Procedure Laterality Date  . LAPAROSCOPIC OVARIAN CYSTECTOMY    . WRIST SURGERY      Social History   Socioeconomic History  . Marital status: Single    Spouse name: Not on file  . Number of children: Not on file  . Years of education: Not on file  . Highest education level: Not on file  Occupational History  . Not on file  Tobacco Use  . Smoking status: Never Smoker  . Smokeless tobacco: Never Used  Substance and Sexual Activity  . Alcohol use: No  . Drug use: No  . Sexual activity: Not on file  Other Topics Concern  . Not on file  Social History Narrative  . Not on file   Social Determinants of Health   Financial Resource Strain:   . Difficulty of Paying Living Expenses: Not on file  Food Insecurity:   . Worried About Charity fundraiser in the Last Year: Not on file  . Ran Out of Food in the Last Year: Not on file  Transportation Needs:   . Lack of Transportation (Medical): Not on file  . Lack of Transportation (Non-Medical): Not on file  Physical Activity:   . Days of Exercise per Week: Not on file  . Minutes of Exercise per Session: Not on file  Stress:   .  Feeling of Stress : Not on file  Social Connections:   . Frequency of Communication with Friends and Family: Not on file  . Frequency of Social Gatherings with Friends and Family: Not on file  . Attends Religious Services: Not on file  . Active Member of Clubs or Organizations: Not on file  . Attends Archivist Meetings: Not on file  . Marital Status: Not on file  Intimate Partner Violence:   . Fear of Current or Ex-Partner: Not on file  . Emotionally Abused: Not on file  . Physically Abused: Not on file  . Sexually Abused: Not on file     Allergies  Allergen Reactions  . Ibuprofen Anaphylaxis and Rash  . Meloxicam Nausea Only     Immunization History  Administered Date(s) Administered  . Influenza,inj,Quad PF,6-35 Mos 05/24/2019  . Influenza-Unspecified  05/23/2014  . Zoster Recombinat (Shingrix) 10/08/2017    Outpatient Medications Prior to Visit  Medication Sig Dispense Refill  . LORazepam (ATIVAN) 0.5 MG tablet Take 0.5 mg by mouth as needed for anxiety.    . multivitamin-lutein (OCUVITE-LUTEIN) CAPS capsule Take 1 capsule by mouth daily.    . Omega-3 Fatty Acids (FISH OIL) 1200 MG CPDR Take by mouth.    . ezetimibe (ZETIA) 10 MG tablet TAKE 1 TABLET BY MOUTH EVERY DAY 90 tablet 3   No facility-administered medications prior to visit.    Review of Systems  Constitutional: Negative for chills and fever.  Respiratory: Positive for shortness of breath. Negative for cough, hemoptysis, sputum production and wheezing.   Gastrointestinal: Negative.   Musculoskeletal: Negative for falls, joint pain and myalgias.  Neurological: Negative for focal weakness.     Objective:   Vitals:   08/22/19 1634  BP: 132/70  Pulse: 80  Temp: 98 F (36.7 C)  TempSrc: Temporal  SpO2: 98%  Weight: 173 lb (78.5 kg)  Height: 5\' 3"  (1.6 m)   98% on   RA BMI Readings from Last 3 Encounters:  08/22/19 30.65 kg/m  11/01/17 28.12 kg/m  09/24/17 27.96 kg/m   Wt Readings from Last 3 Encounters:  08/22/19 173 lb (78.5 kg)  11/01/17 169 lb (76.7 kg)  09/24/17 168 lb (76.2 kg)    Physical Exam Vitals reviewed.  Constitutional:      General: She is not in acute distress.    Appearance: Normal appearance. She is not ill-appearing.  HENT:     Head: Normocephalic and atraumatic.     Nose:     Comments: Deferred due to masking requirement.    Mouth/Throat:     Comments: Deferred due to masking requirement. Eyes:     General: No scleral icterus. Cardiovascular:     Rate and Rhythm: Normal rate and regular rhythm.     Heart sounds: No murmur.  Pulmonary:     Comments: Breathing comfortably on room air, no conversational dyspnea. Clear to auscultation bilaterally. Abdominal:     General: There is no distension.     Palpations: Abdomen is  soft.     Tenderness: There is no abdominal tenderness.  Musculoskeletal:        General: No swelling or deformity.     Cervical back: Neck supple.     Comments: No pain on palpation of left lateral chest  Lymphadenopathy:     Cervical: No cervical adenopathy.  Skin:    General: Skin is warm and dry.     Findings: No rash.     Comments: No bruising or abnormalities  over left lateral chest  Neurological:     General: No focal deficit present.     Mental Status: She is alert.     Motor: No weakness.     Coordination: Coordination normal.  Psychiatric:        Mood and Affect: Mood normal.        Behavior: Behavior normal.      CBC    Component Value Date/Time   WBC 11.5 (H) 09/05/2017 1150   RBC 4.38 09/05/2017 1150   HGB 14.1 09/05/2017 1150   HCT 41.8 09/05/2017 1150   PLT 399 09/05/2017 1150   MCV 95.4 09/05/2017 1150   MCH 32.2 09/05/2017 1150   MCHC 33.7 09/05/2017 1150   RDW 14.8 09/05/2017 1150   LYMPHSABS 5.3 (H) 09/05/2017 1150   MONOABS 0.8 09/05/2017 1150   EOSABS 0.0 09/05/2017 1150   BASOSABS 0.0 09/05/2017 1150    CHEMISTRY No results for input(s): NA, K, CL, CO2, GLUCOSE, BUN, CREATININE, CALCIUM, MG, PHOS in the last 168 hours. CrCl cannot be calculated (Patient's most recent lab result is older than the maximum 21 days allowed.).   Chest Imaging- films reviewed: CXR, 2 view 09/05/2017-small left-sided pleural effusion  Rib xrays 07/16/2017-dependent left pleural effusion, no rib fractures  Pulmonary Functions Testing Results: No flowsheet data found.   Echocardiogram 09/22/2017: Normal LV systolic and diastolic function.  Mildly dilated LA. normal RV, RA.  Trivial MR, PR, TR.  NM SPECT scan 09/24/2017- 75% with normal wall motion, low risk study.  No TWI or ST segment changes during stress.     Assessment & Plan:     ICD-10-CM   1. Intercostal pain  R07.82 CT Chest Wo Contrast    CANCELED: CT Chest Wo Contrast  2. Shortness of breath  R06.02  CT Chest Wo Contrast    CANCELED: CT Chest Wo Contrast  3. Abnormal CXR  R93.89      Dyspnea on exertion, left lateral chest pain, and abnormal chest x-ray with dependent pleural effusion.  If this was a parapneumonic effusion from her pneumonia, this could have caused pain, but would be less likely to cause persistent pain.  This could cause persistent dyspnea if she had restriction due to fibrothorax from an unresolved pleural effusion. She has had an extensive cardiac evaluation and has minimal risk factors for coronary disease, making this a less likely cause of her symptoms. -Chest CT -We will defer follow-up CT to determine if she needs PFTs  RTC in 2 weeks after CT.    Current Outpatient Medications:  .  LORazepam (ATIVAN) 0.5 MG tablet, Take 0.5 mg by mouth as needed for anxiety., Disp: , Rfl:  .  multivitamin-lutein (OCUVITE-LUTEIN) CAPS capsule, Take 1 capsule by mouth daily., Disp: , Rfl:  .  Omega-3 Fatty Acids (FISH OIL) 1200 MG CPDR, Take by mouth., Disp: , Rfl:    I spent 35 minutes on this encounter, including face to face time and non-face to face time spent reviewing records, charting, coordinating care, etc.   Julian Hy, DO [facility] 08/22/2019 5:31 PM

## 2019-08-25 ENCOUNTER — Telehealth: Payer: Self-pay | Admitting: Critical Care Medicine

## 2019-08-25 NOTE — Telephone Encounter (Signed)
Spoke to destiney pt will have to be moved [facility] has not authorized ct yet Joellen Jersey

## 2019-08-28 ENCOUNTER — Ambulatory Visit (HOSPITAL_BASED_OUTPATIENT_CLINIC_OR_DEPARTMENT_OTHER): Payer: 59

## 2019-09-01 ENCOUNTER — Ambulatory Visit (HOSPITAL_BASED_OUTPATIENT_CLINIC_OR_DEPARTMENT_OTHER): Payer: 59

## 2019-09-05 ENCOUNTER — Ambulatory Visit: Payer: 59 | Admitting: Critical Care Medicine

## 2019-09-06 ENCOUNTER — Telehealth: Payer: Self-pay | Admitting: Critical Care Medicine

## 2019-09-06 NOTE — Telephone Encounter (Signed)
Spoke to pt she is aware I am waiting on dr [name] to do the peer to peer for the ct scan and then we will get back to her Sandra Rodriguez

## 2019-09-07 ENCOUNTER — Telehealth: Payer: Self-pay | Admitting: Critical Care Medicine

## 2019-09-07 NOTE — Telephone Encounter (Signed)
Peer to Peer with Dr. [name] and Dr. Thornell Mule? Done to approve CT. CT was approved. Approval [id number]. Paperwork given to [place]. Patient being called to get CT and follow up appointment scheduled.   Nothing further needed at this time.

## 2019-09-08 NOTE — Telephone Encounter (Signed)
Ct chest on 09/11/19 and ov with dr [name] 09/12/19 pt is aware Joellen Jersey

## 2019-09-11 ENCOUNTER — Ambulatory Visit (HOSPITAL_BASED_OUTPATIENT_CLINIC_OR_DEPARTMENT_OTHER)
Admission: RE | Admit: 2019-09-11 | Discharge: 2019-09-11 | Disposition: A | Discharge status: Home / Self Care | Payer: 59 | Source: Ambulatory Visit | Attending: Critical Care Medicine | Admitting: Critical Care Medicine

## 2019-09-11 ENCOUNTER — Other Ambulatory Visit: Payer: Self-pay

## 2019-09-11 DIAGNOSIS — R0782 Intercostal pain: Secondary | ICD-10-CM

## 2019-09-11 DIAGNOSIS — R0602 Shortness of breath: Secondary | ICD-10-CM | POA: Insufficient documentation

## 2019-09-12 ENCOUNTER — Ambulatory Visit: Payer: 59 | Admitting: Critical Care Medicine

## 2019-09-12 ENCOUNTER — Encounter: Payer: Self-pay | Admitting: Critical Care Medicine

## 2019-09-12 VITALS — BP 132/70 | HR 82 | Temp 98.1°F | Ht 65.0 in | Wt 174.4 lb

## 2019-09-12 DIAGNOSIS — R0781 Pleurodynia: Secondary | ICD-10-CM | POA: Diagnosis not present

## 2019-09-12 NOTE — Patient Instructions (Addendum)
Thank you for visiting Dr. [name] at [facility]. We recommend the following: Orders Placed This Encounter  Procedures  . Ambulatory referral to Physical Medicine Rehab   Orders Placed This Encounter  Procedures  . Ambulatory referral to Physical Medicine Rehab    Referral Priority:   Routine    Referral Type:   Rehabilitation    Referral Reason:   Specialty Services Required    Requested Specialty:   Physical Medicine and Rehabilitation    Number of Visits Requested:   1      Return if symptoms worsen or fail to improve.    Please do your part to reduce the spread of COVID-19.

## 2019-09-12 NOTE — Progress Notes (Signed)
Synopsis: Referred in January 2021 for DOE by Practice, [facility].  Subjective:   PATIENT ID: Sandra Rodriguez GENDER: female DOB: [birth year], MRN: [id number]  Chief Complaint  Patient presents with  . Follow-up    Patient is here for follow up after CT on 2/1. Patient feels good overall.     Sandra Rodriguez is a 64 year old woman who presents for follow-up of chronic left-sided pain.  She had her CT scan yesterday.  Her pain is unchanged, and she has a little bit of parasternal left-sided rib pain today.  She has anxiety about why she has had pain for so long, but has had no significant change in the pain over time.  She is scheduled for her first Covid vaccine later this week.        OV 08/22/2019: Sandra Rodriguez is a 64 year old woman who presents for evaluation of chronic dyspnea on exertion and left-sided chest pain that is been persistent for about 3 years.  It has not worsened over that time, but remained stable.  These began around the same time.  She does not have limitations in her daily activities, but notices the dyspnea when walking up a hill.  She never gets short of breath at rest.  She denies cough, sputum production, hemoptysis, wheezing.  She has no personal or family history of lung or rheumatologic disease.  She has never smoked or vape.  She works as an Therapist, music.  She has never had occupational dust exposures.  She describes the chest pain as sharp, quick (short lasting) pain that occurs suddenly when she rolls over at night coughs, hiccups, or sneezes.  It never happens at rest.  She had shingles in the past, but this was in her inguinal and hip area, not overlying her chest.  She has a history of a pneumonia several years ago following her wrist surgery, but had resolution with several courses of antibiotics.  She has never had trauma to her left chest, chest tubes, or rib fractures.  She has had occasional knee swelling in the past, but was evaluated by rheumatology and not felt to  have rheumatologic disease.    Past Medical History:  Diagnosis Date  . Arthritis   . Cellulitis   . Shingles      Family History  Problem Relation Age of Onset  . Heart disease Mother   . Neuromuscular disorder Father   . Breast cancer Sister   . Heart attack Brother   . Diabetes Brother      Past Surgical History:  Procedure Laterality Date  . LAPAROSCOPIC OVARIAN CYSTECTOMY    . WRIST SURGERY      Social History   Socioeconomic History  . Marital status: Single    Spouse name: Not on file  . Number of children: Not on file  . Years of education: Not on file  . Highest education level: Not on file  Occupational History  . Not on file  Tobacco Use  . Smoking status: Never Smoker  . Smokeless tobacco: Never Used  Substance and Sexual Activity  . Alcohol use: No  . Drug use: No  . Sexual activity: Not on file  Other Topics Concern  . Not on file  Social History Narrative  . Not on file   Social Determinants of Health   Financial Resource Strain:   . Difficulty of Paying Living Expenses: Not on file  Food Insecurity:   . Worried About Charity fundraiser in the Last Year:  Not on file  . Ran Out of Food in the Last Year: Not on file  Transportation Needs:   . Lack of Transportation (Medical): Not on file  . Lack of Transportation (Non-Medical): Not on file  Physical Activity:   . Days of Exercise per Week: Not on file  . Minutes of Exercise per Session: Not on file  Stress:   . Feeling of Stress : Not on file  Social Connections:   . Frequency of Communication with Friends and Family: Not on file  . Frequency of Social Gatherings with Friends and Family: Not on file  . Attends Religious Services: Not on file  . Active Member of Clubs or Organizations: Not on file  . Attends Archivist Meetings: Not on file  . Marital Status: Not on file  Intimate Partner Violence:   . Fear of Current or Ex-Partner: Not on file  . Emotionally Abused: Not  on file  . Physically Abused: Not on file  . Sexually Abused: Not on file     Allergies  Allergen Reactions  . Ibuprofen Anaphylaxis and Rash  . Meloxicam Nausea Only     Immunization History  Administered Date(s) Administered  . Influenza,inj,Quad PF,6-35 Mos 05/24/2019  . Influenza-Unspecified 05/23/2014  . Zoster Recombinat (Shingrix) 10/08/2017    Outpatient Medications Prior to Visit  Medication Sig Dispense Refill  . LORazepam (ATIVAN) 0.5 MG tablet Take 0.5 mg by mouth as needed for anxiety.    . multivitamin-lutein (OCUVITE-LUTEIN) CAPS capsule Take 1 capsule by mouth daily.    . Omega-3 Fatty Acids (FISH OIL) 1200 MG CPDR Take by mouth.     No facility-administered medications prior to visit.    Review of Systems  Constitutional: Negative for chills and fever.  Respiratory: Positive for shortness of breath. Negative for cough, hemoptysis, sputum production and wheezing.   Gastrointestinal: Negative.   Musculoskeletal: Negative for falls, joint pain and myalgias.  Neurological: Negative for focal weakness.     Objective:   Vitals:   09/12/19 1135  BP: 132/70  Pulse: 82  Temp: 98.1 F (36.7 C)  TempSrc: Temporal  SpO2: 97%  Weight: 174 lb 6.4 oz (79.1 kg)  Height: 5\' 5"  (1.651 m)   97% on   RA BMI Readings from Last 3 Encounters:  09/12/19 29.02 kg/m  08/22/19 30.65 kg/m  11/01/17 28.12 kg/m   Wt Readings from Last 3 Encounters:  09/12/19 174 lb 6.4 oz (79.1 kg)  08/22/19 173 lb (78.5 kg)  11/01/17 169 lb (76.7 kg)    Physical Exam Vitals reviewed.  Constitutional:      Appearance: She is not ill-appearing.  HENT:     Head: Normocephalic and atraumatic.  Cardiovascular:     Rate and Rhythm: Normal rate and regular rhythm.     Heart sounds: No murmur.  Pulmonary:     Comments: Breathing comfortably on room air, no conversational dyspnea.  Clear to auscultation bilaterally. Abdominal:     General: There is no distension.      Palpations: Abdomen is soft.  Musculoskeletal:        General: No swelling or deformity.     Cervical back: Neck supple.  Lymphadenopathy:     Cervical: No cervical adenopathy.  Skin:    General: Skin is warm and dry.     Findings: No rash.  Neurological:     General: No focal deficit present.     Mental Status: She is alert.     Coordination: Coordination  normal.  Psychiatric:        Mood and Affect: Mood normal.        Behavior: Behavior normal.      CBC    Component Value Date/Time   WBC 11.5 (H) 09/05/2017 1150   RBC 4.38 09/05/2017 1150   HGB 14.1 09/05/2017 1150   HCT 41.8 09/05/2017 1150   PLT 399 09/05/2017 1150   MCV 95.4 09/05/2017 1150   MCH 32.2 09/05/2017 1150   MCHC 33.7 09/05/2017 1150   RDW 14.8 09/05/2017 1150   LYMPHSABS 5.3 (H) 09/05/2017 1150   MONOABS 0.8 09/05/2017 1150   EOSABS 0.0 09/05/2017 1150   BASOSABS 0.0 09/05/2017 1150    CHEMISTRY No results for input(s): NA, K, CL, CO2, GLUCOSE, BUN, CREATININE, CALCIUM, MG, PHOS in the last 168 hours. CrCl cannot be calculated (Patient's most recent lab result is older than the maximum 21 days allowed.).   Chest Imaging- films reviewed: CXR, 2 view 09/05/2017-small left-sided pleural effusion  Rib xrays 07/16/2017-dependent left pleural effusion, no rib fractures  CT chest 09/11/2019-scarring in LUL, lingula> RLL.  Calcified RUL subpleural granuloma.  L ectasis of inferior RML.  Pulmonary Functions Testing Results: No flowsheet data found.   Echocardiogram 09/22/2017: Normal LV systolic and diastolic function.  Mildly dilated LA. normal RV, RA.  Trivial MR, PR, TR.  NM SPECT scan 09/24/2017- 75% with normal wall motion, low risk study.  No TWI or ST segment changes during stress.     Assessment & Plan:     ICD-10-CM   1. Rib pain on left side  R07.81 Ambulatory referral to Physical Medicine Rehab    Chronic left rib pain, likely musculoskeletal in origin.  CT of her chest shows scarring,  but no chronic pleural thickening or other abnormalities to explain her pain. -Referral to PM&R, ideally a physiatrist to help evaluate musculoskeletal causes of pain and identify treatments. -No additional pulmonary evaluation required.   RTC as needed.    Current Outpatient Medications:  .  LORazepam (ATIVAN) 0.5 MG tablet, Take 0.5 mg by mouth as needed for anxiety., Disp: , Rfl:  .  multivitamin-lutein (OCUVITE-LUTEIN) CAPS capsule, Take 1 capsule by mouth daily., Disp: , Rfl:  .  Omega-3 Fatty Acids (FISH OIL) 1200 MG CPDR, Take by mouth., Disp: , Rfl:      Julian Hy, DO [facility] 09/12/2019 1:07 PM

## 2019-09-18 ENCOUNTER — Other Ambulatory Visit: Payer: Self-pay

## 2019-09-18 ENCOUNTER — Encounter: Payer: 59 | Attending: Physical Medicine and Rehabilitation | Admitting: Physical Medicine and Rehabilitation

## 2019-09-18 ENCOUNTER — Encounter: Payer: Self-pay | Admitting: Physical Medicine and Rehabilitation

## 2019-09-18 VITALS — BP 147/83 | HR 85 | Temp 97.5°F | Ht 65.0 in | Wt 172.0 lb

## 2019-09-18 DIAGNOSIS — G588 Other specified mononeuropathies: Secondary | ICD-10-CM

## 2019-09-18 MED ORDER — BACLOFEN 5 MG PO TABS: 1.0000 | ORAL_TABLET | Freq: Three times a day (TID) | ORAL | 1 refills | Status: DC | PRN

## 2019-09-18 NOTE — Progress Notes (Signed)
Subjective:    Patient ID: Sandra Rodriguez, female    DOB: 02-25-1956, 64 y.o.   MRN: [id number]  HPI  Sandra Rodriguez presents today to establish care for left sided rib pain.  She has had the pain for 4 years. There was no inciting incident. It is located in her left ribs and thoracic spine and under her left breast. She sometimes feels tightness extending toward the left side of her neck. The pain occurs when she yawns, hiccups, or when she repositions herself when she sleeps at night. It is a sharp pain that lasts a couple of seconds. She takes no medications for the pain. She has followed with pulmonary and has had a CXR and CT Chest which showed a pleural effusion in 2018 (I have personally reviewed) but were otherwise negative. She gets SOB while walking up hill. She has also had completed cardiac workup that was negative. She works in an Psychologist, forensic during the day. She does not exercise regularly, she states due to "laziness."  Pain Inventory Average Pain 6 Pain Right Now 2 My pain is stabbing  In the last 24 hours, has pain interfered with the following? General activity 0 Relation with others 0 Enjoyment of life 0 What TIME of day is your pain at its worst? night Sleep (in general) Poor  Pain is worse with: walking Pain improves with: na Relief from Meds: na  Mobility walk without assistance ability to climb steps?  yes do you drive?  yes  Function employed # of hrs/week 40  Neuro/Psych No problems in this area  Prior Studies new  Physicians involved in your care new   Family History  Problem Relation Age of Onset  . Heart disease Mother   . Neuromuscular disorder Father   . Breast cancer Sister   . Heart attack Brother   . Diabetes Brother    Social History   Socioeconomic History  . Marital status: Single    Spouse name: Not on file  . Number of children: Not on file  . Years of education: Not on file  . Highest education level: Not on file    Occupational History  . Not on file  Tobacco Use  . Smoking status: Never Smoker  . Smokeless tobacco: Never Used  Substance and Sexual Activity  . Alcohol use: No  . Drug use: No  . Sexual activity: Not on file  Other Topics Concern  . Not on file  Social History Narrative  . Not on file   Social Determinants of Health   Financial Resource Strain:   . Difficulty of Paying Living Expenses: Not on file  Food Insecurity:   . Worried About Charity fundraiser in the Last Year: Not on file  . Ran Out of Food in the Last Year: Not on file  Transportation Needs:   . Lack of Transportation (Medical): Not on file  . Lack of Transportation (Non-Medical): Not on file  Physical Activity:   . Days of Exercise per Week: Not on file  . Minutes of Exercise per Session: Not on file  Stress:   . Feeling of Stress : Not on file  Social Connections:   . Frequency of Communication with Friends and Family: Not on file  . Frequency of Social Gatherings with Friends and Family: Not on file  . Attends Religious Services: Not on file  . Active Member of Clubs or Organizations: Not on file  . Attends Archivist Meetings: Not  on file  . Marital Status: Not on file   Past Surgical History:  Procedure Laterality Date  . LAPAROSCOPIC OVARIAN CYSTECTOMY    . WRIST SURGERY     Past Medical History:  Diagnosis Date  . Arthritis   . Cellulitis   . Shingles    BP (!) 147/83   Pulse 85   Temp (!) 97.5 F (36.4 C)   Ht 5\' 5"  (1.651 m)   Wt 172 lb (78 kg)   SpO2 97%   BMI 28.62 kg/m   Opioid Risk Score:   Fall Risk Score:  `1  Depression screen PHQ 2/9  No flowsheet data found.   Review of Systems  Constitutional: Negative.   HENT: Negative.   Eyes: Negative.   Respiratory: Negative.   Cardiovascular: Negative.   Gastrointestinal: Negative.   Endocrine: Negative.   Genitourinary: Negative.   Musculoskeletal: Positive for arthralgias and myalgias.  Skin: Negative.    Allergic/Immunologic: Negative.   Neurological: Negative.   Hematological: Negative.   Psychiatric/Behavioral: Negative.   All other systems reviewed and are negative.      Objective:   Physical Exam Gen: no distress, normal appearing HEENT: oral mucosa pink and moist, NCAT Cardio: Reg rate Chest: normal effort, normal rate of breathing Abd: soft, non-distended Ext: no edema Skin: intact Neuro: AOx3 Musculoskeletal: Normal gait. Excellent spinal mobility in flexion and extension. No spinal tenderness. No rib or chest wall tenderness.  Psych: pleasant, normal affect     Assessment & Plan:  Sandra Rodriguez presents today to establish care for left sided rib pain.  Her constellation of symptoms is most suggestive to me of phrenic nerve irritation. Her pain worsens with breathing, hiccupping, yawning, positional changes. Discussed that diagnostics would be invasive and we will trial 5mg  Baclofen HS to aid in calming phrenic nerve. Should also help her to sleep better at night. Advised that if dose is well tolerated she can increase to 5mg  TID, but I do not want her to be sleepy while at work so we will start at low dose. If this dose is well tolerated but symptoms persist, can increased to 10mg  TID.  I have personally reviewed her imaging and pulmonologist's note today.  RTC in 1 month to assess progress with above plan.

## 2019-10-02 ENCOUNTER — Telehealth: Payer: Self-pay

## 2019-10-02 ENCOUNTER — Other Ambulatory Visit: Payer: Self-pay | Admitting: Physical Medicine and Rehabilitation

## 2019-10-02 DIAGNOSIS — G588 Other specified mononeuropathies: Secondary | ICD-10-CM

## 2019-10-02 MED ORDER — TIZANIDINE HCL 2 MG PO CAPS: 2.0000 mg | ORAL_CAPSULE | Freq: Every day | ORAL | 1 refills | Status: DC

## 2019-10-02 NOTE — Telephone Encounter (Signed)
Patient called stating she has been taking a muscle relaxer that was prescribed to her at her last visit and she has been nausea and lightheadedness.

## 2019-10-18 ENCOUNTER — Encounter: Payer: 59 | Admitting: Physical Medicine and Rehabilitation

## 2019-11-23 ENCOUNTER — Other Ambulatory Visit: Payer: Self-pay

## 2019-11-23 MED ORDER — LORAZEPAM 0.5 MG PO TABS: 0.5000 mg | ORAL_TABLET | ORAL | 0 refills | Status: DC | PRN

## 2020-05-16 IMAGING — CT CT CHEST W/O CM
2 of 4 series · 15 of 36 positions shown, 18 images · non-contrast
Comparison: October 12, 2015 CT chest.

CLINICAL DATA: Shortness of breath with left-sided chest pain.
History of a left-sided pleural effusion. History of breast cancer.

EXAM:
CT CHEST WITHOUT CONTRAST
TECHNIQUE: Multidetector CT imaging of the chest was performed following the
standard protocol without IV contrast.

[Series 2: thorax · axial · 0.85mm/px · z∈[-380,-134]mm · 12 of 145 slices shown, 15 images]
[im 11/145  mediastinal]
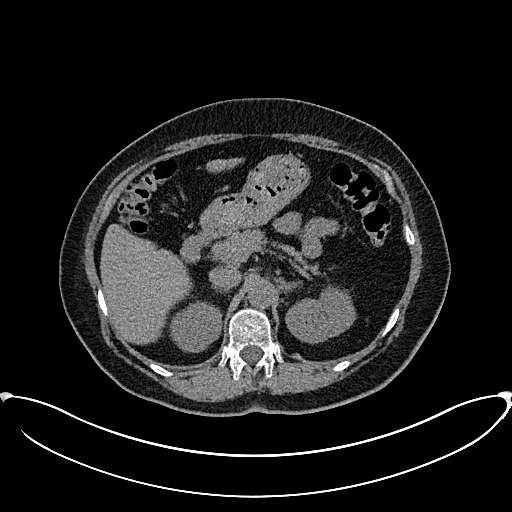
[im 11/145  lung]
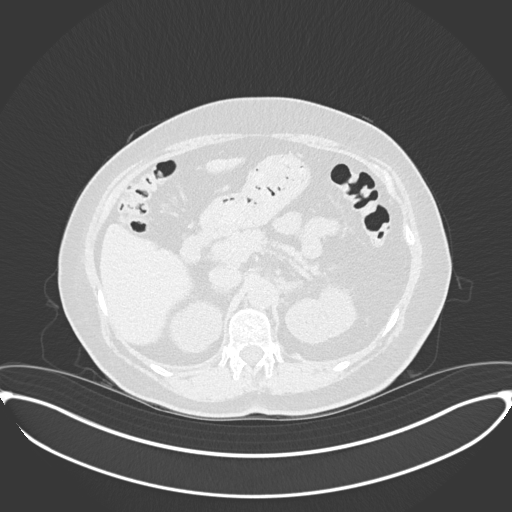
[im 21/145  lung]
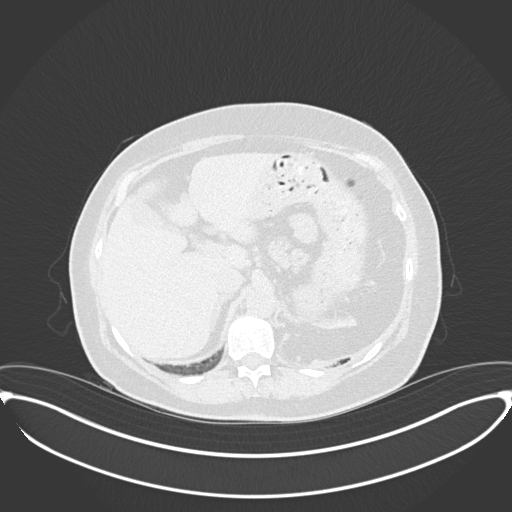
[im 31/145  lung]
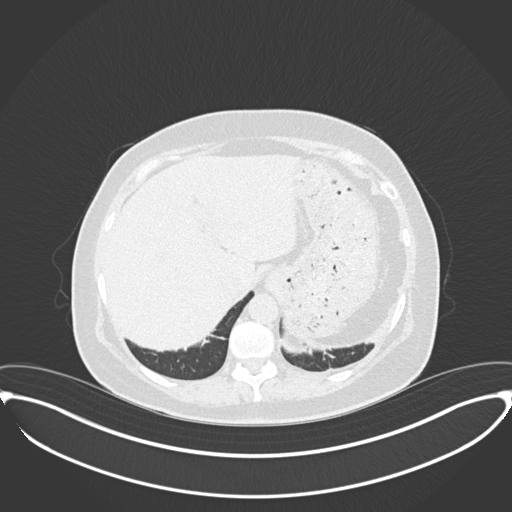
[im 42/145  lung]
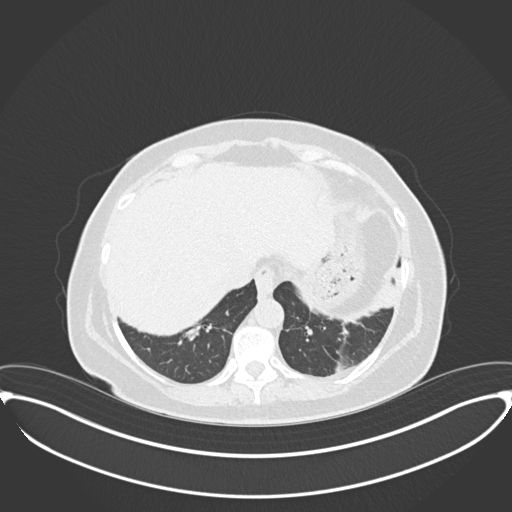
[im 52/145  mediastinal]
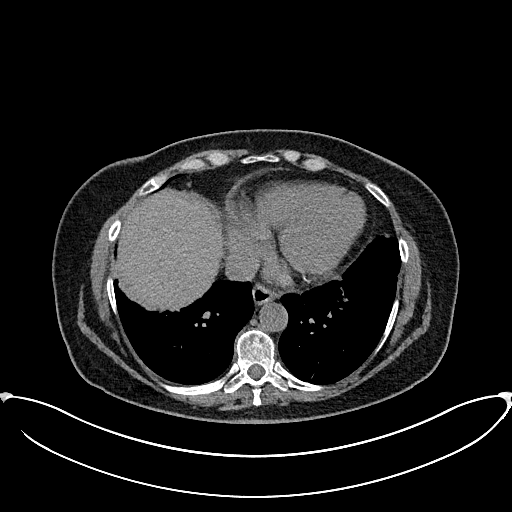
[im 52/145  lung]
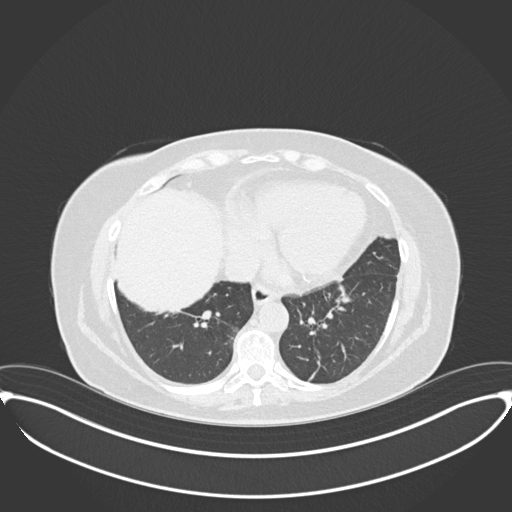
[im 62/145  lung]
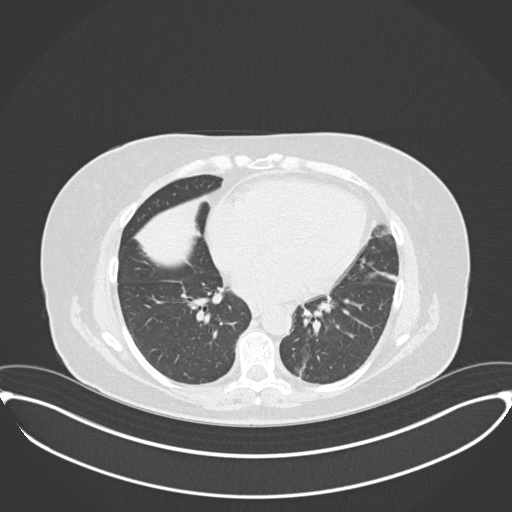
[im 83/145  lung]
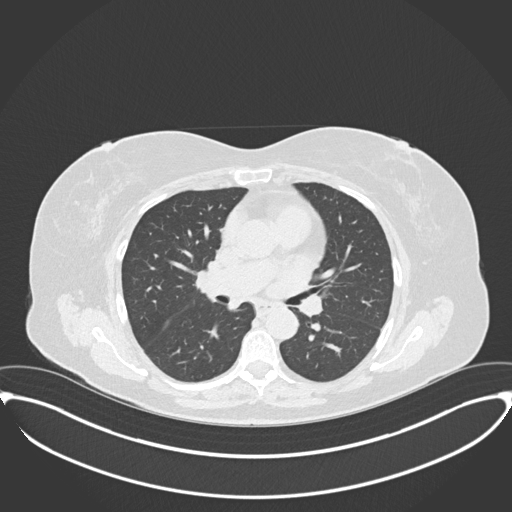
[im 93/145  lung]
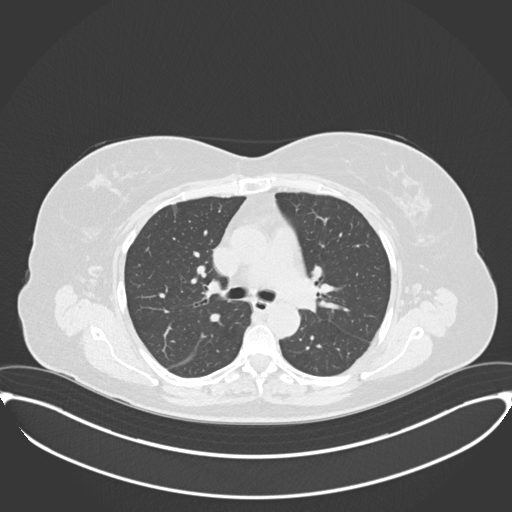
[im 103/145  mediastinal]
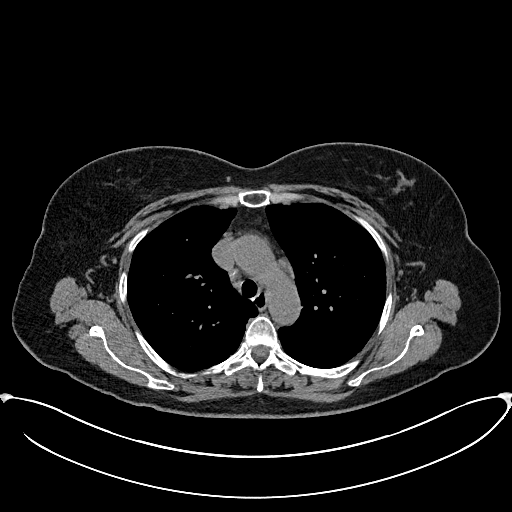
[im 103/145  lung]
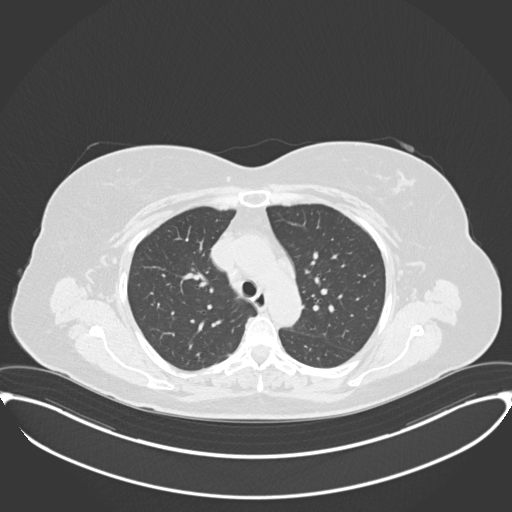
[im 114/145  lung]
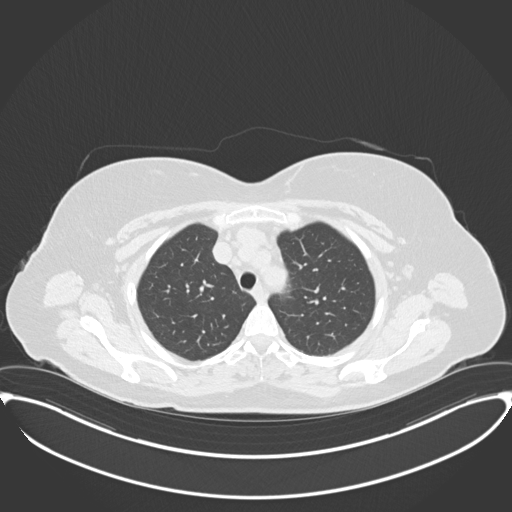
[im 124/145  lung]
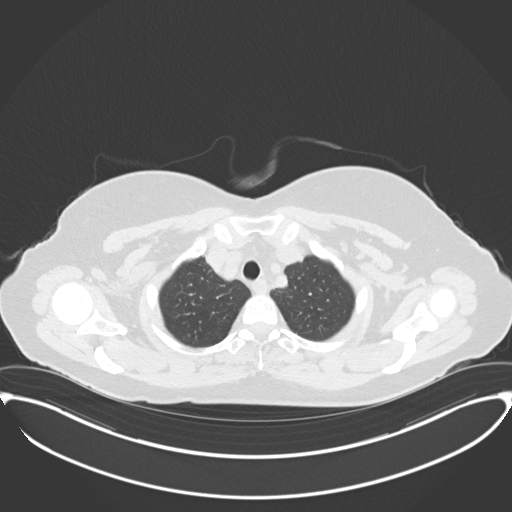
[im 134/145  lung]
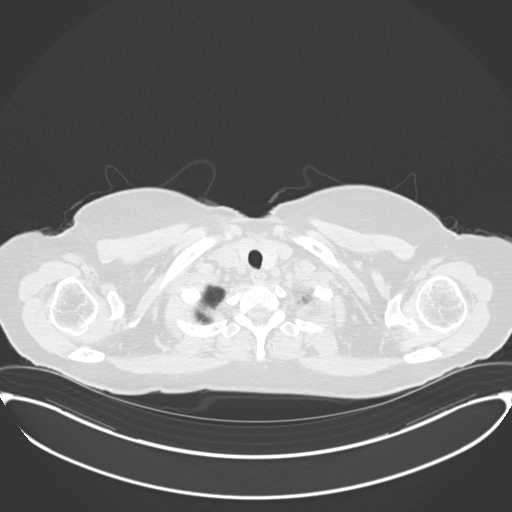

[Series 5: coronal · coronal · 0.60mm/px · 3 of 136 slices shown]
[im 28/136  lung]
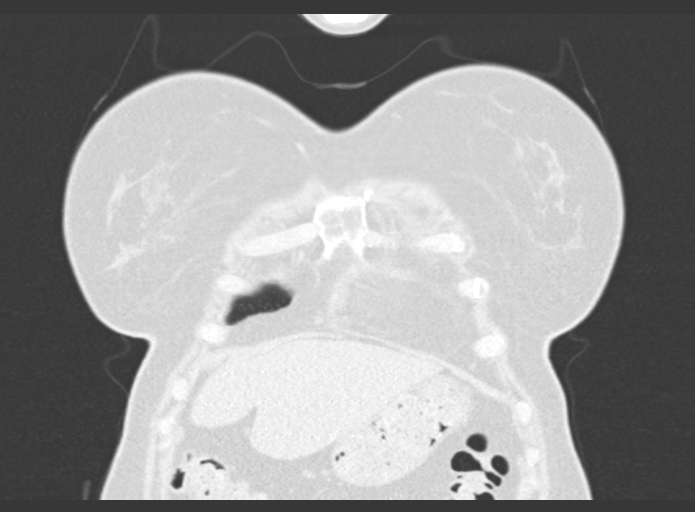
[im 55/136  lung]
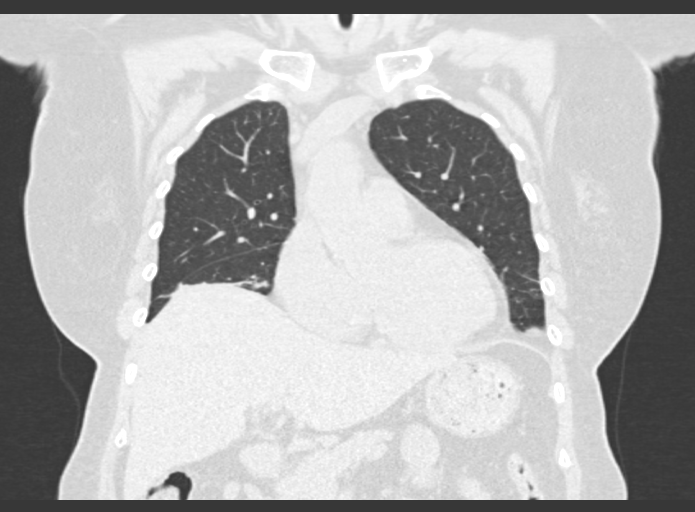
[im 82/136  lung]
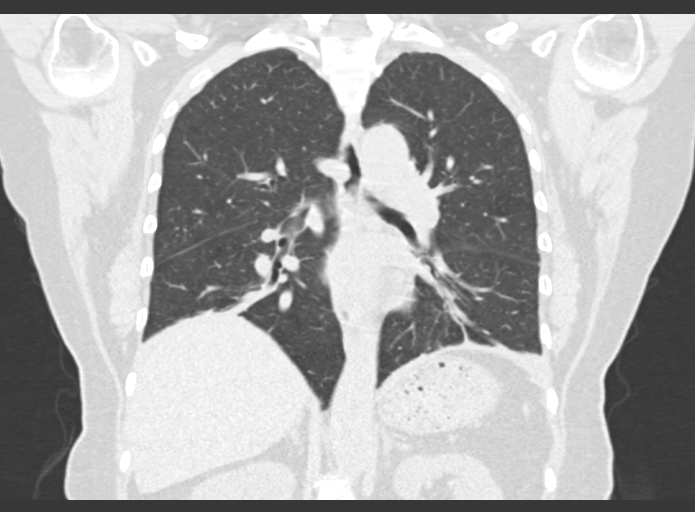

[15 of 36 positions shown; findings below may reference images not displayed]

FINDINGS: Cardiovascular: The heart size is borderline enlarged. There is no
evidence for a thoracic aortic aneurysm. No significant
atherosclerotic changes. Minimal coronary artery calcifications are
noted. There is no significant pericardial effusion.

Mediastinum/Nodes:

--mild mediastinal adenopathy is noted and is essentially stable
since 2158 consistent with a benign process.

--No axillary lymphadenopathy.

--No supraclavicular lymphadenopathy.

--Normal thyroid gland.

--The esophagus is unremarkable

Lungs/Pleura: There is a calcified granuloma in the right upper
lobe. Atelectasis is noted in the left lower lobe, lingula, and
right middle lobe. The trachea is unremarkable. There is no CT
evidence for an endobronchial filling defect.

Upper Abdomen: No acute abnormality.

Musculoskeletal: No chest wall abnormality. No acute or significant
osseous findings.
IMPRESSION: 1. No acute abnormality of the chest.
2. Borderline cardiomegaly.
3. Minimal coronary artery calcifications.
4. Scattered areas of atelectasis as detailed.

## 2020-09-25 ENCOUNTER — Other Ambulatory Visit: Payer: Self-pay

## 2020-09-25 ENCOUNTER — Encounter: Payer: Self-pay | Admitting: Nurse Practitioner

## 2020-09-25 ENCOUNTER — Ambulatory Visit: Payer: 59 | Admitting: Nurse Practitioner

## 2020-09-25 VITALS — BP 120/68 | HR 84 | Temp 97.9°F | Ht 65.0 in | Wt 175.0 lb

## 2020-09-25 DIAGNOSIS — M25562 Pain in left knee: Secondary | ICD-10-CM | POA: Diagnosis not present

## 2020-09-25 DIAGNOSIS — F411 Generalized anxiety disorder: Secondary | ICD-10-CM

## 2020-09-25 DIAGNOSIS — E7800 Pure hypercholesterolemia, unspecified: Secondary | ICD-10-CM | POA: Diagnosis not present

## 2020-09-25 DIAGNOSIS — F5102 Adjustment insomnia: Secondary | ICD-10-CM

## 2020-09-25 MED ORDER — TRAZODONE HCL 100 MG PO TABS
100.0000 mg | ORAL_TABLET | Freq: Every evening | ORAL | 0 refills | Status: DC | PRN
Start: 2020-09-25 — End: 2020-12-22

## 2020-09-25 NOTE — Patient Instructions (Addendum)
Increase Trazodone 100 mg at night as needed for insomnia Return for fasting labs Obtain mammogram and eye exam as recommended Call office for colonoscopy referral  Follow-up in 1 year or sooner as needed  Preventive Care 51-65 Years Old, Female Preventive care refers to lifestyle choices and visits with your health care provider that can promote health and wellness. This includes:  A yearly physical exam. This is also called an annual wellness visit.  Regular dental and eye exams.  Immunizations.  Screening for certain conditions.  Healthy lifestyle choices, such as: ? Eating a healthy diet. ? Getting regular exercise. ? Not using drugs or products that contain nicotine and tobacco. ? Limiting alcohol use. What can I expect for my preventive care visit? Physical exam Your health care provider will check your:  Height and weight. These may be used to calculate your BMI (body mass index). BMI is a measurement that tells if you are at a healthy weight.  Heart rate and blood pressure.  Body temperature.  Skin for abnormal spots. Counseling Your health care provider may ask you questions about your:  Past medical problems.  Family's medical history.  Alcohol, tobacco, and drug use.  Emotional well-being.  Home life and relationship well-being.  Sexual activity.  Diet, exercise, and sleep habits.  Work and work Statistician.  Access to firearms.  Method of birth control.  Menstrual cycle.  Pregnancy history. What immunizations do I need? Vaccines are usually given at various ages, according to a schedule. Your health care provider will recommend vaccines for you based on your age, medical history, and lifestyle or other factors, such as travel or where you work.   What tests do I need? Blood tests  Lipid and cholesterol levels. These may be checked every 5 years, or more often if you are over 30 years old.  Hepatitis C test.  Hepatitis B  test. Screening  Lung cancer screening. You may have this screening every year starting at age 95 if you have a 30-pack-year history of smoking and currently smoke or have quit within the past 15 years.  Colorectal cancer screening. ? All adults should have this screening starting at age 65 and continuing until age 25. ? Your health care provider may recommend screening at age 41 if you are at increased risk. ? You will have tests every 1-10 years, depending on your results and the type of screening test.  Diabetes screening. ? This is done by checking your blood sugar (glucose) after you have not eaten for a while (fasting). ? You may have this done every 1-3 years.  Mammogram. ? This may be done every 1-2 years. ? Talk with your health care provider about when you should start having regular mammograms. This may depend on whether you have a family history of breast cancer.  BRCA-related cancer screening. This may be done if you have a family history of breast, ovarian, tubal, or peritoneal cancers.  Pelvic exam and Pap test. ? This may be done every 3 years starting at age 53. ? Starting at age 89, this may be done every 5 years if you have a Pap test in combination with an HPV test. Other tests  STD (sexually transmitted disease) testing, if you are at risk.  Bone density scan. This is done to screen for osteoporosis. You may have this scan if you are at high risk for osteoporosis. Talk with your health care provider about your test results, treatment options, and if necessary, the  need for more tests. Follow these instructions at home: Eating and drinking  Eat a diet that includes fresh fruits and vegetables, whole grains, lean protein, and low-fat dairy products.  Take vitamin and mineral supplements as recommended by your health care provider.  Do not drink alcohol if: ? Your health care provider tells you not to drink. ? You are pregnant, may be pregnant, or are planning  to become pregnant.  If you drink alcohol: ? Limit how much you have to 0-1 drink a day. ? Be aware of how much alcohol is in your drink. In the [place] one drink equals one 12 oz bottle of beer (355 mL), one 5 oz glass of wine (148 mL), or one 1 oz glass of hard liquor (44 mL).   Lifestyle  Take daily care of your teeth and gums. Brush your teeth every morning and night with fluoride toothpaste. Floss one time each day.  Stay active. Exercise for at least 30 minutes 5 or more days each week.  Do not use any products that contain nicotine or tobacco, such as cigarettes, e-cigarettes, and chewing tobacco. If you need help quitting, ask your health care provider.  Do not use drugs.  If you are sexually active, practice safe sex. Use a condom or other form of protection to prevent STIs (sexually transmitted infections).  If you do not wish to become pregnant, use a form of birth control. If you plan to become pregnant, see your health care provider for a prepregnancy visit.  If told by your health care provider, take low-dose aspirin daily starting at age 55.  Find healthy ways to cope with stress, such as: ? Meditation, yoga, or listening to music. ? Journaling. ? Talking to a trusted person. ? Spending time with friends and family. Safety  Always wear your seat belt while driving or riding in a vehicle.  Do not drive: ? If you have been drinking alcohol. Do not ride with someone who has been drinking. ? When you are tired or distracted. ? While texting.  Wear a helmet and other protective equipment during sports activities.  If you have firearms in your house, make sure you follow all gun safety procedures. What's next?  Visit your health care provider once a year for an annual wellness visit.  Ask your health care provider how often you should have your eyes and teeth checked.  Stay up to date on all vaccines. This information is not intended to replace advice given to you  by your health care provider. Make sure you discuss any questions you have with your health care provider. Document Revised: 04/30/2020 Document Reviewed: 04/07/2018 Elsevier Patient Education  2021 [place] Maintenance, Female Adopting a healthy lifestyle and getting preventive care are important in promoting health and wellness. Ask your health care provider about:  The right schedule for you to have regular tests and exams.  Things you can do on your own to prevent diseases and keep yourself healthy. What should I know about diet, weight, and exercise? Eat a healthy diet  Eat a diet that includes plenty of vegetables, fruits, low-fat dairy products, and lean protein.  Do not eat a lot of foods that are high in solid fats, added sugars, or sodium.   Maintain a healthy weight Body mass index (BMI) is used to identify weight problems. It estimates body fat based on height and weight. Your health care provider can help determine your BMI and help you achieve or maintain  a healthy weight. Get regular exercise Get regular exercise. This is one of the most important things you can do for your health. Most adults should:  Exercise for at least 150 minutes each week. The exercise should increase your heart rate and make you sweat (moderate-intensity exercise).  Do strengthening exercises at least twice a week. This is in addition to the moderate-intensity exercise.  Spend less time sitting. Even light physical activity can be beneficial. Watch cholesterol and blood lipids Have your blood tested for lipids and cholesterol at 65 years of age, then have this test every 5 years. Have your cholesterol levels checked more often if:  Your lipid or cholesterol levels are high.  You are older than 65 years of age.  You are at high risk for heart disease. What should I know about cancer screening? Depending on your health history and family history, you may need to have cancer  screening at various ages. This may include screening for:  Breast cancer.  Cervical cancer.  Colorectal cancer.  Skin cancer.  Lung cancer. What should I know about heart disease, diabetes, and high blood pressure? Blood pressure and heart disease  High blood pressure causes heart disease and increases the risk of stroke. This is more likely to develop in people who have high blood pressure readings, are of African descent, or are overweight.  Have your blood pressure checked: ? Every 3-5 years if you are 34-35 years of age. ? Every year if you are 38 years old or older. Diabetes Have regular diabetes screenings. This checks your fasting blood sugar level. Have the screening done:  Once every three years after age 39 if you are at a normal weight and have a low risk for diabetes.  More often and at a younger age if you are overweight or have a high risk for diabetes. What should I know about preventing infection? Hepatitis B If you have a higher risk for hepatitis B, you should be screened for this virus. Talk with your health care provider to find out if you are at risk for hepatitis B infection. Hepatitis C Testing is recommended for:  Everyone born from [birth year] through 1965.  Anyone with known risk factors for hepatitis C. Sexually transmitted infections (STIs)  Get screened for STIs, including gonorrhea and chlamydia, if: ? You are sexually active and are younger than 65 years of age. ? You are older than 65 years of age and your health care provider tells you that you are at risk for this type of infection. ? Your sexual activity has changed since you were last screened, and you are at increased risk for chlamydia or gonorrhea. Ask your health care provider if you are at risk.  Ask your health care provider about whether you are at high risk for HIV. Your health care provider may recommend a prescription medicine to help prevent HIV infection. If you choose to take  medicine to prevent HIV, you should first get tested for HIV. You should then be tested every 3 months for as long as you are taking the medicine. Pregnancy  If you are about to stop having your period (premenopausal) and you may become pregnant, seek counseling before you get pregnant.  Take 400 to 800 micrograms (mcg) of folic acid every day if you become pregnant.  Ask for birth control (contraception) if you want to prevent pregnancy. Osteoporosis and menopause Osteoporosis is a disease in which the bones lose minerals and strength with aging. This can  result in bone fractures. If you are 66 years old or older, or if you are at risk for osteoporosis and fractures, ask your health care provider if you should:  Be screened for bone loss.  Take a calcium or vitamin D supplement to lower your risk of fractures.  Be given hormone replacement therapy (HRT) to treat symptoms of menopause. Follow these instructions at home: Lifestyle  Do not use any products that contain nicotine or tobacco, such as cigarettes, e-cigarettes, and chewing tobacco. If you need help quitting, ask your health care provider.  Do not use street drugs.  Do not share needles.  Ask your health care provider for help if you need support or information about quitting drugs. Alcohol use  Do not drink alcohol if: ? Your health care provider tells you not to drink. ? You are pregnant, may be pregnant, or are planning to become pregnant.  If you drink alcohol: ? Limit how much you use to 0-1 drink a day. ? Limit intake if you are breastfeeding.  Be aware of how much alcohol is in your drink. In the [place] one drink equals one 12 oz bottle of beer (355 mL), one 5 oz glass of wine (148 mL), or one 1 oz glass of hard liquor (44 mL). General instructions  Schedule regular health, dental, and eye exams.  Stay current with your vaccines.  Tell your health care provider if: ? You often feel depressed. ? You have  ever been abused or do not feel safe at home. Summary  Adopting a healthy lifestyle and getting preventive care are important in promoting health and wellness.  Follow your health care provider's instructions about healthy diet, exercising, and getting tested or screened for diseases.  Follow your health care provider's instructions on monitoring your cholesterol and blood pressure. This information is not intended to replace advice given to you by your health care provider. Make sure you discuss any questions you have with your health care provider. Document Revised: 07/20/2018 Document Reviewed: 07/20/2018 Elsevier Patient Education  2021 [place].  Insomnia Insomnia is a sleep disorder that makes it difficult to fall asleep or stay asleep. Insomnia can cause fatigue, low energy, difficulty concentrating, mood swings, and poor performance at work or school. There are three different ways to classify insomnia:  Difficulty falling asleep.  Difficulty staying asleep.  Waking up too early in the morning. Any type of insomnia can be long-term (chronic) or short-term (acute). Both are common. Short-term insomnia usually lasts for three months or less. Chronic insomnia occurs at least three times a week for longer than three months. What are the causes? Insomnia may be caused by another condition, situation, or substance, such as:  Anxiety.  Certain medicines.  Gastroesophageal reflux disease (GERD) or other gastrointestinal conditions.  Asthma or other breathing conditions.  Restless legs syndrome, sleep apnea, or other sleep disorders.  Chronic pain.  Menopause.  Stroke.  Abuse of alcohol, tobacco, or illegal drugs.  Mental health conditions, such as depression.  Caffeine.  Neurological disorders, such as Alzheimer's disease.  An overactive thyroid (hyperthyroidism). Sometimes, the cause of insomnia may not be known. What increases the risk? Risk factors for  insomnia include:  Gender. Women are affected more often than men.  Age. Insomnia is more common as you get older.  Stress.  Lack of exercise.  Irregular work schedule or working night shifts.  Traveling between different time zones.  Certain medical and mental health conditions. What are the signs  or symptoms? If you have insomnia, the main symptom is having trouble falling asleep or having trouble staying asleep. This may lead to other symptoms, such as:  Feeling fatigued or having low energy.  Feeling nervous about going to sleep.  Not feeling rested in the morning.  Having trouble concentrating.  Feeling irritable, anxious, or depressed. How is this diagnosed? This condition may be diagnosed based on:  Your symptoms and medical history. Your health care provider may ask about: ? Your sleep habits. ? Any medical conditions you have. ? Your mental health.  A physical exam. How is this treated? Treatment for insomnia depends on the cause. Treatment may focus on treating an underlying condition that is causing insomnia. Treatment may also include:  Medicines to help you sleep.  Counseling or therapy.  Lifestyle adjustments to help you sleep better. Follow these instructions at home: Eating and drinking  Limit or avoid alcohol, caffeinated beverages, and cigarettes, especially close to bedtime. These can disrupt your sleep.  Do not eat a large meal or eat spicy foods right before bedtime. This can lead to digestive discomfort that can make it hard for you to sleep.   Sleep habits  Keep a sleep diary to help you and your health care provider figure out what could be causing your insomnia. Write down: ? When you sleep. ? When you wake up during the night. ? How well you sleep. ? How rested you feel the next day. ? Any side effects of medicines you are taking. ? What you eat and drink.  Make your bedroom a dark, comfortable place where it is easy to fall  asleep. ? Put up shades or blackout curtains to block light from outside. ? Use a white noise machine to block noise. ? Keep the temperature cool.  Limit screen use before bedtime. This includes: ? Watching TV. ? Using your smartphone, tablet, or computer.  Stick to a routine that includes going to bed and waking up at the same times every day and night. This can help you fall asleep faster. Consider making a quiet activity, such as reading, part of your nighttime routine.  Try to avoid taking naps during the day so that you sleep better at night.  Get out of bed if you are still awake after 15 minutes of trying to sleep. Keep the lights down, but try reading or doing a quiet activity. When you feel sleepy, go back to bed.   General instructions  Take over-the-counter and prescription medicines only as told by your health care provider.  Exercise regularly, as told by your health care provider. Avoid exercise starting several hours before bedtime.  Use relaxation techniques to manage stress. Ask your health care provider to suggest some techniques that may work well for you. These may include: ? Breathing exercises. ? Routines to release muscle tension. ? Visualizing peaceful scenes.  Make sure that you drive carefully. Avoid driving if you feel very sleepy.  Keep all follow-up visits as told by your health care provider. This is important. Contact a health care provider if:  You are tired throughout the day.  You have trouble in your daily routine due to sleepiness.  You continue to have sleep problems, or your sleep problems get worse. Get help right away if:  You have serious thoughts about hurting yourself or someone else. If you ever feel like you may hurt yourself or others, or have thoughts about taking your own life, get help right away. You  can go to your nearest emergency department or call:  Your local emergency services (911 in the [place]  A suicide crisis  helpline, such as the [facility] at (781) 106-6332. This is open 24 hours a day. Summary  Insomnia is a sleep disorder that makes it difficult to fall asleep or stay asleep.  Insomnia can be long-term (chronic) or short-term (acute).  Treatment for insomnia depends on the cause. Treatment may focus on treating an underlying condition that is causing insomnia.  Keep a sleep diary to help you and your health care provider figure out what could be causing your insomnia. This information is not intended to replace advice given to you by your health care provider. Make sure you discuss any questions you have with your health care provider. Document Revised: 06/06/2020 Document Reviewed: 06/06/2020 Elsevier Patient Education  2021 [place]. Trazodone Tablets What is this medicine? TRAZODONE (TRAZ oh done) is used to treat depression. This medicine may be used for other purposes; ask your health care provider or pharmacist if you have questions. COMMON BRAND NAME(S): Desyrel What should I tell my health care provider before I take this medicine? They need to know if you have any of these conditions:  attempted suicide or thinking about it  bipolar disorder  bleeding problems  glaucoma  heart disease, or previous heart attack  irregular heart beat  kidney or liver disease  low levels of sodium in the blood  an unusual or allergic reaction to trazodone, other medicines, foods, dyes or preservatives  pregnant or trying to get pregnant  breast-feeding How should I use this medicine? Take this medicine by mouth with a glass of water. Follow the directions on the prescription label. Take this medicine shortly after a meal or a light snack. Take your medicine at regular intervals. Do not take your medicine more often than directed. Do not stop taking this medicine suddenly except upon the advice of your doctor. Stopping this medicine too quickly may cause  serious side effects or your condition may worsen. A special MedGuide will be given to you by the pharmacist with each prescription and refill. Be sure to read this information carefully each time. Talk to your pediatrician regarding the use of this medicine in children. Special care may be needed. Overdosage: If you think you have taken too much of this medicine contact a poison control center or emergency room at once. NOTE: This medicine is only for you. Do not share this medicine with others. What if I miss a dose? If you miss a dose, take it as soon as you can. If it is almost time for your next dose, take only that dose. Do not take double or extra doses. What may interact with this medicine? Do not take this medicine with any of the following medications:  certain medicines for fungal infections like fluconazole, itraconazole, ketoconazole, posaconazole, voriconazole  cisapride  dronedarone  linezolid  MAOIs like Carbex, Eldepryl, Marplan, Nardil, and Parnate  mesoridazine  methylene blue (injected into a vein)  pimozide  saquinavir  thioridazine This medicine may also interact with the following medications:  alcohol  antiviral medicines for HIV or AIDS  aspirin and aspirin-like medicines  barbiturates like phenobarbital  certain medicines for blood pressure, heart disease, irregular heart beat  certain medicines for depression, anxiety, or psychotic disturbances  certain medicines for migraine headache like almotriptan, eletriptan, frovatriptan, naratriptan, rizatriptan, sumatriptan, zolmitriptan  certain medicines for seizures like carbamazepine and phenytoin  certain medicines  for sleep  certain medicines that treat or prevent blood clots like dalteparin, enoxaparin, warfarin  digoxin  fentanyl  lithium  NSAIDS, medicines for pain and inflammation, like ibuprofen or naproxen  other medicines that prolong the QT interval (cause an abnormal heart  rhythm) like dofetilide  rasagiline  supplements like St. John's wort, kava kava, valerian  tramadol  tryptophan This list may not describe all possible interactions. Give your health care provider a list of all the medicines, herbs, non-prescription drugs, or dietary supplements you use. Also tell them if you smoke, drink alcohol, or use illegal drugs. Some items may interact with your medicine. What should I watch for while using this medicine? Tell your doctor if your symptoms do not get better or if they get worse. Visit your doctor or health care professional for regular checks on your progress. Because it may take several weeks to see the full effects of this medicine, it is important to continue your treatment as prescribed by your doctor. Patients and their families should watch out for new or worsening thoughts of suicide or depression. Also watch out for sudden changes in feelings such as feeling anxious, agitated, panicky, irritable, hostile, aggressive, impulsive, severely restless, overly excited and hyperactive, or not being able to sleep. If this happens, especially at the beginning of treatment or after a change in dose, call your health care professional. Dennis Bast may get drowsy or dizzy. Do not drive, use machinery, or do anything that needs mental alertness until you know how this medicine affects you. Do not stand or sit up quickly, especially if you are an older patient. This reduces the risk of dizzy or fainting spells. Alcohol may interfere with the effect of this medicine. Avoid alcoholic drinks. This medicine may cause dry eyes and blurred vision. If you wear contact lenses you may feel some discomfort. Lubricating drops may help. See your eye doctor if the problem does not go away or is severe. Your mouth may get dry. Chewing sugarless gum, sucking hard candy and drinking plenty of water may help. Contact your doctor if the problem does not go away or is severe. What side effects  may I notice from receiving this medicine? Side effects that you should report to your doctor or health care professional as soon as possible:  allergic reactions like skin rash, itching or hives, swelling of the face, lips, or tongue  elevated mood, decreased need for sleep, racing thoughts, impulsive behavior  confusion  fast, irregular heartbeat  feeling faint or lightheaded, falls  feeling agitated, angry, or irritable  loss of balance or coordination  painful or prolonged erections  restlessness, pacing, inability to keep still  suicidal thoughts or other mood changes  tremors  trouble sleeping  seizures  unusual bleeding or bruising Side effects that usually do not require medical attention (report to your doctor or health care professional if they continue or are bothersome):  change in sex drive or performance  change in appetite or weight  constipation  headache  muscle aches or pains  nausea This list may not describe all possible side effects. Call your doctor for medical advice about side effects. You may report side effects to FDA at 1-800-FDA-1088. Where should I keep my medicine? Keep out of the reach of children. Store at room temperature between 15 and 30 degrees C (59 to 86 degrees F). Protect from light. Keep container tightly closed. Throw away any unused medicine after the expiration date. NOTE: This sheet is a  summary. It may not cover all possible information. If you have questions about this medicine, talk to your doctor, pharmacist, or health care provider.  2021 Elsevier/Gold Standard (2020-06-17 14:46:11)

## 2020-09-25 NOTE — Progress Notes (Signed)
Subjective:  Patient ID: Sandra Rodriguez, female    DOB: 1955-10-20  Age: 65 y.o. MRN: [id number]  Chief Complaint  Patient presents with  . Hyperlipidemia    Medication follow up    HPI   Sandra Rodriguez is a 65 year old Caucasian female that presents for follow-up of hyperlipidemia, insomnia, and anxiety. Last appointment was over a year ago. She states she is up-to-date on screening mammogram. She is due for colonoscopy and eye exam. Pt has declined referrals for screening exams today. States she will arrange mammogram and eye exam independently; she will notify office when she is ready for colonoscopy referral. She has obtained COVID-19 vaccines and booster. She states she has acute left knee pain with swelling today. Treatment has included compression and Tylenol.   Hyperlipidemia Sandra Rodriguez has a history of hyperlipidemia for several years. Current treatment includes Fish oil 1,200 mg daily, heart healthy diet, and physical activity. She states she does not to take medications, prefers natural supplements if possible. Medical record review reveals lipid panel on 01/03/2019 TC 176, Trig 106, HDL 42, and LDL 113. She is not fasting for today's appointment. She agrees to return in a.m. for fasting labs.   Insomnia Sandra Rodriguez has experienced insomnia for several years. She states she goes to bed approximately 11:30 p.m. and awakens at 5 am. She limits caffeine intake to one Coke Zero beverage daily prior to 3 pm. Treatment includes Trazodone 100 mg at bedtime.   Anxiety Sandra Rodriguez has a history of anxiety for several years. Current treatment includes Lorazepam 0.5 mg PRN. GAD-7 score 4 in office today. PHQ-9 score 5 in office today.   Left knee pain Sandra Rodriguez has been experiencing left knee pain for 3-days. She states she felt a "pop" while she was walking with her dog. States the pain is throbbing intermittently, swelling present. Treatment has included Tylenol and compression. She states this has happened before and  resolved without intervention. Declined steroid injection today.   Current Outpatient Medications on File Prior to Visit  Medication Sig Dispense Refill  . LORazepam (ATIVAN) 0.5 MG tablet Take 1 tablet (0.5 mg total) by mouth as needed for anxiety. Please call our office to set up an appointment fasting in May 2065. Thank you, Dr. Tobie Poet 30 tablet 0  . multivitamin-lutein (OCUVITE-LUTEIN) CAPS capsule Take 1 capsule by mouth daily.    . Omega-3 Fatty Acids (FISH OIL) 1200 MG CPDR Take by mouth.    . traZODone (DESYREL) 50 MG tablet 50 mg. Take 1-2 tablets by mouth at night time as needed for sleep.     No current facility-administered medications on file prior to visit.   Past Medical History:  Diagnosis Date  . Anxiety   . Arthritis   . Cellulitis   . Mixed hyperlipidemia   . Primary insomnia   . Shingles    Past Surgical History:  Procedure Laterality Date  . LAPAROSCOPIC OVARIAN CYSTECTOMY    . Left wrist surgery     pin placed  . WRIST SURGERY      Family History  Problem Relation Age of Onset  . Heart disease Mother   . Neuromuscular disorder Father   . Breast cancer Sister   . Heart attack Brother   . Diabetes Brother    Social History   Socioeconomic History  . Marital status: Single    Spouse name: Not on file  . Number of children: Not on file  . Years of education: Not on file  . Highest education  level: Not on file  Occupational History  . Not on file  Tobacco Use  . Smoking status: Never Smoker  . Smokeless tobacco: Never Used  Vaping Use  . Vaping Use: Never used  Substance and Sexual Activity  . Alcohol use: No  . Drug use: No  . Sexual activity: Not on file  Other Topics Concern  . Not on file  Social History Narrative  . Not on file   Social Determinants of Health   Financial Resource Strain: Not on file  Food Insecurity: Not on file  Transportation Needs: Not on file  Physical Activity: Not on file  Stress: Not on file  Social  Connections: Not on file    Review of Systems  Constitutional: Negative for fatigue and fever.  HENT: Negative for congestion, ear pain, sinus pressure and sore throat.   Eyes: Negative for pain.  Respiratory: Negative for cough, chest tightness, shortness of breath and wheezing.   Cardiovascular: Negative for chest pain and palpitations.  Gastrointestinal: Negative for abdominal pain, constipation, diarrhea, nausea and vomiting.  Genitourinary: Negative for dysuria and hematuria.  Musculoskeletal: Positive for back pain (left lateral torso pain) and joint swelling (left knee). Negative for arthralgias and myalgias.  Skin: Negative for rash.  Neurological: Negative for dizziness, weakness and headaches.  Psychiatric/Behavioral: Positive for sleep disturbance (Currently treated with Trazodone). Negative for dysphoric mood. The patient is nervous/anxious (currently treated with Lorazepam).      Objective:  BP 120/68 (BP Location: Right Arm, Patient Position: Sitting)   Pulse 84   Temp 97.9 F (36.6 C) (Temporal)   Ht 5\' 5"  (1.651 m)   Wt 175 lb (79.4 kg)   SpO2 97%   BMI 29.12 kg/m   BP/Weight 09/25/2020 08/16/7937 0/10/90  Systolic BP 330 076 226  Diastolic BP 68 83 70  Wt. (Lbs) 175 172 174.4  BMI 29.12 28.62 29.02    Physical Exam Vitals reviewed.  Constitutional:      Appearance: Normal appearance.  HENT:     Head: Normocephalic.     Right Ear: Tympanic membrane, ear canal and external ear normal.     Left Ear: Tympanic membrane, ear canal and external ear normal.     Nose: Nose normal.     Mouth/Throat:     Mouth: Mucous membranes are moist.  Eyes:     Pupils: Pupils are equal, round, and reactive to light.  Cardiovascular:     Rate and Rhythm: Normal rate and regular rhythm.     Pulses: Normal pulses.     Heart sounds: Normal heart sounds.  Pulmonary:     Effort: Pulmonary effort is normal.     Breath sounds: Normal breath sounds.  Abdominal:     General:  Bowel sounds are normal.     Palpations: Abdomen is soft.  Musculoskeletal:        General: Swelling (left knee) and tenderness (left knee) present.     Cervical back: Normal range of motion.  Skin:    General: Skin is warm and dry.     Capillary Refill: Capillary refill takes less than 2 seconds.  Neurological:     General: No focal deficit present.     Mental Status: She is alert and oriented to person, place, and time.  Psychiatric:        Mood and Affect: Mood normal.        Behavior: Behavior normal.        Thought Content: Thought content normal.  Judgment: Judgment normal.         Lab Results  Component Value Date   WBC 11.5 (H) 09/05/2017   HGB 14.1 09/05/2017   HCT 41.8 09/05/2017   PLT 399 09/05/2017   GLUCOSE 97 09/05/2017   CHOL 196 09/17/2017   TRIG 83 09/17/2017   HDL 50 09/17/2017   LDLCALC 129 (H) 09/17/2017   ALT 14 09/17/2017   AST 18 09/17/2017   NA 139 09/05/2017   K 3.7 09/05/2017   CL 106 09/05/2017   CREATININE 0.92 09/05/2017   BUN 15 09/05/2017   CO2 25 09/05/2017      Assessment & Plan:    1. Pure hypercholesterolemia-labs pending - CBC with Differential/Platelet - Comprehensive metabolic panel - Lipid panel  2. Generalized anxiety disorder-well controlled - TSH  3. Adjustment insomnia - traZODone (DESYREL) 100 MG tablet; Take 1 tablet (100 mg total) by mouth at bedtime as needed for sleep.  Dispense: 90 tablet; Refill: 0  4. Acute pain of left knee -Rest, ice, compression, elevate left knee -Tylenol as needed for pain  Increase Trazodone 100 mg at night as needed for insomnia Return for fasting labs Obtain mammogram and eye exam as recommended Call office for colonoscopy referral  Follow-up in 1 year or sooner as needed    Follow-up: 73-months        An After Visit Summary was printed and given to the patient.  Rip Harbour, NP [place] [phone]

## 2020-09-26 ENCOUNTER — Other Ambulatory Visit: Payer: 59

## 2020-09-27 LAB — COMPREHENSIVE METABOLIC PANEL
ALT: 17 IU/L (ref 0–32)
AST: 19 IU/L (ref 0–40)
Albumin/Globulin Ratio: 1.4 (ref 1.2–2.2)
Albumin: 4 g/dL (ref 3.8–4.8)
Alkaline Phosphatase: 126 IU/L — ABNORMAL HIGH (ref 44–121)
BUN/Creatinine Ratio: 26 (ref 12–28)
BUN: 16 mg/dL (ref 8–27)
Bilirubin Total: 0.2 mg/dL (ref 0.0–1.2)
CO2: 21 mmol/L (ref 20–29)
Calcium: 9.1 mg/dL (ref 8.7–10.3)
Chloride: 100 mmol/L (ref 96–106)
Creatinine, Ser: 0.61 mg/dL (ref 0.57–1.00)
GFR calc Af Amer: 111 mL/min/{1.73_m2} (ref >59)
GFR calc non Af Amer: 96 mL/min/{1.73_m2} (ref >59)
Globulin, Total: 2.9 g/dL (ref 1.5–4.5)
Glucose: 86 mg/dL (ref 65–99)
Potassium: 4.6 mmol/L (ref 3.5–5.2)
Sodium: 137 mmol/L (ref 134–144)
Total Protein: 6.9 g/dL (ref 6.0–8.5)

## 2020-09-27 LAB — CBC WITH DIFFERENTIAL/PLATELET
Basophils Absolute: 0 10*3/uL (ref 0.0–0.2)
Basos: 0 %
EOS (ABSOLUTE): 0.1 10*3/uL (ref 0.0–0.4)
Eos: 1 %
Hematocrit: 40.9 % (ref 34.0–46.6)
Hemoglobin: 13.8 g/dL (ref 11.1–15.9)
Immature Grans (Abs): 0 10*3/uL (ref 0.0–0.1)
Immature Granulocytes: 0 %
Lymphocytes Absolute: 5.9 10*3/uL — ABNORMAL HIGH (ref 0.7–3.1)
Lymphs: 60 %
MCH: 31.9 pg (ref 26.6–33.0)
MCHC: 33.7 g/dL (ref 31.5–35.7)
MCV: 95 fL (ref 79–97)
Monocytes Absolute: 0.7 10*3/uL (ref 0.1–0.9)
Monocytes: 7 %
Neutrophils Absolute: 3.1 10*3/uL (ref 1.4–7.0)
Neutrophils: 32 %
Platelets: 431 10*3/uL (ref 150–450)
RBC: 4.32 x10E6/uL (ref 3.77–5.28)
RDW: 13.1 % (ref 11.7–15.4)
WBC: 9.8 10*3/uL (ref 3.4–10.8)

## 2020-09-27 LAB — LIPID PANEL
Chol/HDL Ratio: 4.3 ratio (ref 0.0–4.4)
Cholesterol, Total: 187 mg/dL (ref 100–199)
HDL: 43 mg/dL (ref >39)
LDL Chol Calc (NIH): 124 mg/dL — ABNORMAL HIGH (ref 0–99)
Triglycerides: 108 mg/dL (ref 0–149)
VLDL Cholesterol Cal: 20 mg/dL (ref 5–40)

## 2020-09-27 LAB — CARDIOVASCULAR RISK ASSESSMENT

## 2020-09-27 LAB — TSH: TSH: 1.66 u[IU]/mL (ref 0.450–4.500)

## 2020-10-03 ENCOUNTER — Other Ambulatory Visit: Payer: Self-pay

## 2020-10-03 MED ORDER — LORAZEPAM 0.5 MG PO TABS: 0.5000 mg | ORAL_TABLET | ORAL | 0 refills | Status: DC | PRN

## 2020-12-10 ENCOUNTER — Other Ambulatory Visit: Payer: Self-pay

## 2020-12-10 MED ORDER — LORAZEPAM 0.5 MG PO TABS: 0.5000 mg | ORAL_TABLET | ORAL | 0 refills | Status: DC | PRN

## 2020-12-22 ENCOUNTER — Other Ambulatory Visit: Payer: Self-pay | Admitting: Nurse Practitioner

## 2020-12-22 DIAGNOSIS — F5102 Adjustment insomnia: Secondary | ICD-10-CM

## 2020-12-23 ENCOUNTER — Other Ambulatory Visit: Payer: Self-pay

## 2020-12-23 NOTE — Telephone Encounter (Signed)
Error

## 2021-02-20 ENCOUNTER — Other Ambulatory Visit: Payer: Self-pay

## 2021-02-20 ENCOUNTER — Ambulatory Visit: Payer: 59 | Admitting: Nurse Practitioner

## 2021-02-20 ENCOUNTER — Encounter: Payer: Self-pay | Admitting: Nurse Practitioner

## 2021-02-20 VITALS — BP 128/82 | HR 80 | Temp 97.5°F | Ht 65.0 in | Wt 171.0 lb

## 2021-02-20 DIAGNOSIS — M79662 Pain in left lower leg: Secondary | ICD-10-CM

## 2021-02-20 DIAGNOSIS — R600 Localized edema: Secondary | ICD-10-CM | POA: Diagnosis not present

## 2021-02-20 MED ORDER — CYCLOBENZAPRINE HCL 10 MG PO TABS
10.0000 mg | ORAL_TABLET | Freq: Three times a day (TID) | ORAL | 0 refills | Status: DC | PRN
Start: 2021-02-20 — End: 2021-12-01

## 2021-02-20 NOTE — Patient Instructions (Signed)
Take Tylenol as directed Rest, elevate left leg, alternate heat/ice as needed, may use compression socks or wrap We will call you with left leg [place] appt.  Take Flexeril 10 mg 3 times daily as needed Follow-up as needed  RICE Therapy for Routine Care of Injuries Many injuries can be cared for with rest, ice, compression, and elevation (RICE therapy). This includes: Resting the injured body part. Putting ice on the injury. Putting pressure (compression) on the injury. Raising the injured part (elevation). Using RICE therapy can help to lessen pain and swelling. Supplies needed: Ice. Plastic bag. Towel. Elastic bandage. Pillow or pillows to raise your injured body part. How to care for your injury with RICE therapy Rest Try to rest the injured part of your body. You can go back to your normal activities when your doctor says it is okay to do them and when you can do themwithout pain. If you rest the injury too much, it may not heal as well. Some injuries heal better with early movement instead of resting for too long. Ask your doctor ifyou should do exercises to help your injury get better. Ice  If told, put ice on the injured area. To do this: Put ice in a plastic bag. Place a towel between your skin and the bag. Leave the ice on for 20 minutes, 2-3 times a day. Take off the ice if your skin turns bright red. This is very important. If you cannot feel pain, heat, or cold, you have a greater risk of damage to the area. Do not put ice on your bare skin. Use ice for as many days as your doctor tells you to use it.  Compression Put pressure on the injured area. This can be done with an elastic bandage. If this type of bandage has been put on your injury: Follow instructions on the package the bandage came in about how to use it. Do not wrap the bandage too tightly. Wrap the bandage more loosely if part of your body beyond the bandage is blue, swollen, cold, painful, or loses  feeling. Take off the bandage and put it on again every 3-4 hours or as told by your doctor. See your doctor if the bandage seems to make your problems worse.  Elevation Raise the injured area above the level of your heart while you are sitting orlying down. Follow these instructions at home: If your symptoms get worse or last a long time, make a follow-up appointment with your doctor. You may need to have imaging tests, such as X-rays or an MRI. If you have imaging tests, ask how to get your results when they are ready. Return to your normal activities when your doctor says that it is safe. Keep all follow-up visits. Contact a doctor if: You keep having pain and swelling. Your symptoms get worse. Get help right away if: You have sudden, very bad pain at your injury or lower than your injury. You have redness or more swelling around your injury. You have tingling or numbness at your injury or lower than your injury, and it does not go away when you take off the bandage. Summary Many injuries can be cared for using rest, ice, compression, and elevation (RICE therapy). You can go back to your normal activities when your doctor says it is okay and when you can do them without pain. Put ice on the injured area as told by your doctor. Get help if your symptoms get worse or if you keep  having pain and swelling. This information is not intended to replace advice given to you by your health care provider. Make sure you discuss any questions you have with your healthcare provider. Document Revised: 05/16/2020 Document Reviewed: 05/16/2020 Elsevier Patient Education  2022 [employer].

## 2021-02-20 NOTE — Progress Notes (Signed)
Acute Office Visit  Subjective:    Patient ID: Sandra Rodriguez, female    DOB: July 03, 1956, 65 y.o.   MRN: [id number]  Chief Complaint  Patient presents with   Left calf muscle pain    HPI Patient is in today for left calf pain and swelling. She denies injury to left leg, new shoes, or previous trauma. Onset was 76-month-ago. Treatment has included Tylenol, heat application, compression wrap, and elevation. Standing aggravates left calf pain and swelling. Rest and elevation alleviates pain minimally. She states she has minor varicose veins to left upper thigh.  Past Medical History:  Diagnosis Date   Anxiety    Arthritis    Cellulitis    Mixed hyperlipidemia    Primary insomnia    Shingles     Past Surgical History:  Procedure Laterality Date   LAPAROSCOPIC OVARIAN CYSTECTOMY     Left wrist surgery     pin placed   WRIST SURGERY      Family History  Problem Relation Age of Onset   Heart disease Mother    Neuromuscular disorder Father    Breast cancer Sister    Heart attack Brother    Diabetes Brother     Social History   Socioeconomic History   Marital status: Single    Spouse name: Not on file   Number of children: Not on file   Years of education: Not on file   Highest education level: Not on file  Occupational History   Not on file  Tobacco Use   Smoking status: Never   Smokeless tobacco: Never  Vaping Use   Vaping Use: Never used  Substance and Sexual Activity   Alcohol use: No   Drug use: No   Sexual activity: Not on file  Other Topics Concern   Not on file  Social History Narrative   Not on file   Social Determinants of Health   Financial Resource Strain: Not on file  Food Insecurity: Not on file  Transportation Needs: Not on file  Physical Activity: Not on file  Stress: Not on file  Social Connections: Not on file  Intimate Partner Violence: Not on file    Outpatient Medications Prior to Visit  Medication Sig Dispense Refill    LORazepam (ATIVAN) 0.5 MG tablet Take 1 tablet (0.5 mg total) by mouth as needed for anxiety. 30 tablet 0   multivitamin-lutein (OCUVITE-LUTEIN) CAPS capsule Take 1 capsule by mouth daily.     Omega-3 Fatty Acids (FISH OIL) 1200 MG CPDR Take by mouth.     traZODone (DESYREL) 100 MG tablet TAKE 1 TABLET BY MOUTH AT BEDTIME AS NEEDED FOR SLEEP. 90 tablet 0   No facility-administered medications prior to visit.    Allergies  Allergen Reactions   Ibuprofen Anaphylaxis and Rash   Meloxicam Nausea Only   Onion Nausea Only    Review of Systems  Constitutional:  Negative for appetite change, fatigue and fever.  HENT:  Negative for congestion, ear pain, sinus pressure and sore throat.   Eyes:  Negative for pain.  Respiratory:  Negative for cough, chest tightness, shortness of breath and wheezing.   Cardiovascular:  Negative for chest pain and palpitations.  Gastrointestinal:  Negative for abdominal pain, constipation, diarrhea, nausea and vomiting.  Genitourinary:  Negative for dysuria and hematuria.  Musculoskeletal:  Positive for joint swelling and myalgias. Negative for arthralgias and back pain.       Left lower leg  Skin:  Negative for rash.  Neurological:  Negative for dizziness, weakness and headaches.  Psychiatric/Behavioral:  Negative for dysphoric mood. The patient is not nervous/anxious.       Objective:    Physical Exam Vitals reviewed.  Constitutional:      Appearance: Normal appearance.  Musculoskeletal:        General: Tenderness (left calf) present. Normal range of motion.  Skin:    General: Skin is warm and dry.     Capillary Refill: Capillary refill takes less than 2 seconds.  Neurological:     General: No focal deficit present.     Mental Status: She is alert and oriented to person, place, and time.  Psychiatric:        Mood and Affect: Mood normal.        Behavior: Behavior normal.        Thought Content: Thought content normal.        Judgment: Judgment  normal.    BP 128/82 (BP Location: Left Arm, Patient Position: Sitting)   Pulse 80   Temp (!) 97.5 F (36.4 C) (Temporal)   Ht 5\' 5"  (1.651 m)   Wt 171 lb (77.6 kg)   SpO2 98%   BMI 28.46 kg/m  Wt Readings from Last 3 Encounters:  02/20/21 171 lb (77.6 kg)  09/25/20 175 lb (79.4 kg)  09/18/19 172 lb (78 kg)    Health Maintenance Due  Topic Date Due   HIV Screening  Never done   Hepatitis C Screening  Never done   TETANUS/TDAP  Never done   PAP SMEAR-Modifier  Never done   COLONOSCOPY (Pts 45-55yrs Insurance coverage will need to be confirmed)  Never done   MAMMOGRAM  Never done   Zoster Vaccines- Shingrix (2 of 2) 12/03/2017   COVID-19 Vaccine (4 - Booster for Moderna series) 11/27/2020    There are no preventive care reminders to display for this patient.   Lab Results  Component Value Date   TSH 1.660 09/26/2020   Lab Results  Component Value Date   WBC 9.8 09/26/2020   HGB 13.8 09/26/2020   HCT 40.9 09/26/2020   MCV 95 09/26/2020   PLT 431 09/26/2020   Lab Results  Component Value Date   NA 137 09/26/2020   K 4.6 09/26/2020   CO2 21 09/26/2020   GLUCOSE 86 09/26/2020   BUN 16 09/26/2020   CREATININE 0.61 09/26/2020   BILITOT 0.2 09/26/2020   ALKPHOS 126 (H) 09/26/2020   AST 19 09/26/2020   ALT 17 09/26/2020   PROT 6.9 09/26/2020   ALBUMIN 4.0 09/26/2020   CALCIUM 9.1 09/26/2020   ANIONGAP 8 09/05/2017   Lab Results  Component Value Date   CHOL 187 09/26/2020   Lab Results  Component Value Date   HDL 43 09/26/2020   Lab Results  Component Value Date   LDLCALC 124 (H) 09/26/2020   Lab Results  Component Value Date   TRIG 108 09/26/2020   Lab Results  Component Value Date   CHOLHDL 4.3 09/26/2020   No results found for: HGBA1C       Assessment & Plan:   1. Pain of left calf - VAS [place] LOWER EXTREMITY VENOUS (DVT); Future - cyclobenzaprine (FLEXERIL) 10 MG tablet; Take 1 tablet (10 mg total) by mouth 3 (three) times daily as  needed for muscle spasms.  Dispense: 33 tablet; Refill: 0  2. Edema of left lower leg - VAS [place] LOWER EXTREMITY VENOUS (DVT); Future    Take Tylenol as directed  Rest, elevate left leg, alternate heat/ice as needed, may use compression socks or wrap We will call you with left leg [place] appt.  Take Flexeril 10 mg 3 times daily as needed Follow-up as needed   I,Lauren M Auman,acting as a scribe for [employer], NP.,have documented all relevant documentation on the behalf of Rip Harbour, NP,as directed by  Rip Harbour, NP while in the presence of Rip Harbour, NP.    I, Rip Harbour, NP, have reviewed all documentation for this visit. The documentation on 02/20/21 for the exam, diagnosis, procedures, and orders are all accurate and complete.    Follow-up: PRN

## 2021-02-25 ENCOUNTER — Telehealth: Payer: Self-pay | Admitting: Nurse Practitioner

## 2021-02-25 NOTE — Telephone Encounter (Signed)
   Sandra Rodriguez has been scheduled for the following appointment:  WHAT: VASCULAR U/S  WHERE: [facility] DATE: 02/26/21 TIME: 11:00 AM ARRIVAL TIME  Patient has been made aware.

## 2021-03-11 ENCOUNTER — Encounter: Payer: Self-pay | Admitting: Nurse Practitioner

## 2021-04-04 ENCOUNTER — Other Ambulatory Visit: Payer: Self-pay

## 2021-04-04 MED ORDER — LORAZEPAM 0.5 MG PO TABS: 0.5000 mg | ORAL_TABLET | ORAL | 1 refills | Status: DC | PRN

## 2021-04-25 ENCOUNTER — Other Ambulatory Visit: Payer: Self-pay | Admitting: Nurse Practitioner

## 2021-04-25 DIAGNOSIS — F5102 Adjustment insomnia: Secondary | ICD-10-CM

## 2021-06-30 ENCOUNTER — Encounter: Payer: Self-pay | Admitting: Nurse Practitioner

## 2021-06-30 ENCOUNTER — Telehealth: Payer: Medicare Other | Admitting: Nurse Practitioner

## 2021-06-30 VITALS — Ht 65.0 in | Wt 165.0 lb

## 2021-06-30 DIAGNOSIS — J0110 Acute frontal sinusitis, unspecified: Secondary | ICD-10-CM

## 2021-06-30 DIAGNOSIS — R051 Acute cough: Secondary | ICD-10-CM

## 2021-06-30 MED ORDER — FLUTICASONE PROPIONATE 50 MCG/ACT NA SUSP: 2.0000 | Freq: Every day | NASAL | 6 refills | Status: DC

## 2021-06-30 MED ORDER — AZITHROMYCIN 250 MG PO TABS: ORAL_TABLET | ORAL | 0 refills | Status: AC

## 2021-06-30 MED ORDER — PROMETHAZINE-DM 6.25-15 MG/5ML PO SYRP: 5.0000 mL | ORAL_SOLUTION | Freq: Four times a day (QID) | ORAL | 0 refills | Status: DC | PRN

## 2021-06-30 NOTE — Progress Notes (Signed)
Virtual Visit via Telephone Note   This visit type was conducted due to national recommendations for restrictions regarding the COVID-19 Pandemic (e.g. social distancing) in an effort to limit this patient's exposure and mitigate transmission in our community.  Due to her co-morbid illnesses, this patient is at least at moderate risk for complications without adequate follow up.  This format is felt to be most appropriate for this patient at this time.  The patient did not have access to video technology/had technical difficulties with video requiring transitioning to audio format only (telephone).  All issues noted in this document were discussed and addressed.  No physical exam could be performed with this format.  Patient verbally consented to a telehealth visit.   Date:  06/30/2021   ID:  Sandra Rodriguez, DOB August 24, 1955, MRN [id number]  Patient Location: Home Provider Location: Office/Clinic  PCP:  Rip Harbour, NP   Evaluation Performed:  Follow-Up Visit  Chief Complaint:  Sinus congestion  History of Present Illness:    Sandra Rodriguez is a 65 y.o. female with   Upper respiratory symptoms She complains of frontal sinus tenderness, nasal congestion, post-nasal-drip,  and productive cough with clear colored sputum.Denies fever, chills, night sweats or weight loss. Onset of symptoms was  2 weeks ago and staying constant.Treatment has included Mucinex and OTC decongestant.  Past history is significant for pneumonia. She has obtained COVID-19 vaccines and booster.   The patient does have symptoms concerning for COVID-19 infection (fever, chills, cough, or new shortness of breath).    Past Medical History:  Diagnosis Date   Anxiety    Arthritis    Cellulitis    Mixed hyperlipidemia    Primary insomnia    Shingles     Past Surgical History:  Procedure Laterality Date   LAPAROSCOPIC OVARIAN CYSTECTOMY     Left wrist surgery     pin placed   WRIST SURGERY      Family  History  Problem Relation Age of Onset   Heart disease Mother    Neuromuscular disorder Father    Breast cancer Sister    Heart attack Brother    Diabetes Brother     Social History   Socioeconomic History   Marital status: Single    Spouse name: Not on file   Number of children: Not on file   Years of education: Not on file   Highest education level: Not on file  Occupational History   Not on file  Tobacco Use   Smoking status: Never   Smokeless tobacco: Never  Vaping Use   Vaping Use: Never used  Substance and Sexual Activity   Alcohol use: No   Drug use: No   Sexual activity: Not on file  Other Topics Concern   Not on file  Social History Narrative   Not on file   Social Determinants of Health   Financial Resource Strain: Not on file  Food Insecurity: Not on file  Transportation Needs: Not on file  Physical Activity: Not on file  Stress: Not on file  Social Connections: Not on file  Intimate Partner Violence: Not on file    Outpatient Medications Prior to Visit  Medication Sig Dispense Refill   cyclobenzaprine (FLEXERIL) 10 MG tablet Take 1 tablet (10 mg total) by mouth 3 (three) times daily as needed for muscle spasms. 33 tablet 0   LORazepam (ATIVAN) 0.5 MG tablet Take 1 tablet (0.5 mg total) by mouth as needed for anxiety. 30 tablet 1  multivitamin-lutein (OCUVITE-LUTEIN) CAPS capsule Take 1 capsule by mouth daily.     Omega-3 Fatty Acids (FISH OIL) 1200 MG CPDR Take by mouth.     traZODone (DESYREL) 100 MG tablet TAKE 1 TABLET BY MOUTH EVERY DAY AT BEDTIME AS NEEDED FOR SLEEP 90 tablet 0   No facility-administered medications prior to visit.    Allergies:   Ibuprofen, Meloxicam, and Onion   Social History   Tobacco Use   Smoking status: Never   Smokeless tobacco: Never  Vaping Use   Vaping Use: Never used  Substance Use Topics   Alcohol use: No   Drug use: No     Review of Systems  Constitutional:  Positive for malaise/fatigue. Negative  for chills and fever.  HENT:  Positive for congestion, sinus pain and sore throat. Negative for ear pain.        Post-nasal-drip  Eyes:  Positive for discharge and redness.  Respiratory:  Positive for cough. Negative for shortness of breath.   Cardiovascular:  Negative for chest pain.  Gastrointestinal:  Negative for abdominal pain, nausea and vomiting.  Genitourinary: Negative.   Musculoskeletal:  Negative for myalgias.  Neurological:  Positive for headaches.  Endo/Heme/Allergies: Negative.   Psychiatric/Behavioral: Negative.      Labs/Other Tests and Data Reviewed:    Recent Labs: 09/26/2020: ALT 17; BUN 16; Creatinine, Ser 0.61; Hemoglobin 13.8; Platelets 431; Potassium 4.6; Sodium 137; TSH 1.660   Recent Lipid Panel Lab Results  Component Value Date/Time   CHOL 187 09/26/2020 08:49 AM   TRIG 108 09/26/2020 08:49 AM   HDL 43 09/26/2020 08:49 AM   CHOLHDL 4.3 09/26/2020 08:49 AM   LDLCALC 124 (H) 09/26/2020 08:49 AM    Wt Readings from Last 3 Encounters:  02/20/21 171 lb (77.6 kg)  09/25/20 175 lb (79.4 kg)  09/18/19 172 lb (78 kg)     Objective:    Vital Signs:  Ht 5\' 5"  (1.651 m)   Wt 165 lb (74.8 kg)   BMI 27.46 kg/m     Physical Exam No physical exam due to telemedicine visit  ASSESSMENT & PLAN:     1. Acute non-recurrent frontal sinusitis - azithromycin (ZITHROMAX) 250 MG tablet; Take 2 tablets on day 1, then 1 tablet daily on days 2 through 5  Dispense: 6 tablet; Refill: 0 - fluticasone (FLONASE) 50 MCG/ACT nasal spray; Place 2 sprays into both nostrils daily.  Dispense: 16 g; Refill: 6  2. Acute cough - promethazine-dextromethorphan (PROMETHAZINE-DM) 6.25-15 MG/5ML syrup; Take 5 mLs by mouth 4 (four) times daily as needed.  Dispense: 118 mL; Refill: 0    Rest and push fluids Continue Mucinex and OTC decongestant Notify office if symptoms worsen or fail to improve   COVID-19 Education: The signs and symptoms of COVID-19 were discussed with the patient  and how to seek care for testing (follow up with PCP or arrange E-visit). The importance of social distancing was discussed today.   I spent 10 minutes dedicated to the care of this patient on the date of this encounter to include face-to-face time with the patient, as well as: EMR and prescription medication management.  Follow Up:  In Person prn  Henry Russel  06/30/2021 8:55 AM    [place]

## 2021-07-01 ENCOUNTER — Ambulatory Visit: Payer: 59 | Admitting: Nurse Practitioner

## 2021-07-07 LAB — HM MAMMOGRAPHY

## 2021-07-14 ENCOUNTER — Encounter: Payer: Self-pay | Admitting: Nurse Practitioner

## 2021-08-05 ENCOUNTER — Other Ambulatory Visit: Payer: Self-pay | Admitting: Nurse Practitioner

## 2021-08-05 DIAGNOSIS — R051 Acute cough: Secondary | ICD-10-CM

## 2021-10-02 ENCOUNTER — Other Ambulatory Visit: Payer: Self-pay | Admitting: Legal Medicine

## 2021-11-27 ENCOUNTER — Other Ambulatory Visit: Payer: Self-pay | Admitting: Nurse Practitioner

## 2021-12-01 ENCOUNTER — Encounter: Payer: Self-pay | Admitting: Nurse Practitioner

## 2021-12-01 ENCOUNTER — Ambulatory Visit (INDEPENDENT_AMBULATORY_CARE_PROVIDER_SITE_OTHER): Payer: Medicare Other | Admitting: Nurse Practitioner

## 2021-12-01 VITALS — BP 112/66 | HR 86 | Resp 18 | Ht 65.0 in | Wt 169.8 lb

## 2021-12-01 DIAGNOSIS — F411 Generalized anxiety disorder: Secondary | ICD-10-CM

## 2021-12-01 MED ORDER — LORAZEPAM 0.5 MG PO TABS: 0.5000 mg | ORAL_TABLET | Freq: Two times a day (BID) | ORAL | 0 refills | Status: DC | PRN
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
## 2021-12-01 NOTE — Patient Instructions (Signed)
Return for fasting physical exam ?Take Lorazepam as needed for anxiety ? ? ?Managing Anxiety, Adult ?After being diagnosed with anxiety, you may be relieved to know why you have felt or behaved a certain way. You may also feel overwhelmed about the treatment ahead and what it will mean for your life. With care and support, you can manage this condition. ?How to manage lifestyle changes ?Managing stress and anxiety ? ?Stress is your body's reaction to life changes and events, both good and bad. Most stress will last just a few hours, but stress can be ongoing and can lead to more than just stress. Although stress can play a major role in anxiety, it is not the same as anxiety. Stress is usually caused by something external, such as a deadline, test, or competition. Stress normally passes after the triggering event has ended.  ?Anxiety is caused by something internal, such as imagining a terrible outcome or worrying that something will go wrong that will devastate you. Anxiety often does not go away even after the triggering event is over, and it can become long-term (chronic) worry. It is important to understand the differences between stress and anxiety and to manage your stress effectively so that it does not lead to an anxious response. ?Talk with your health care provider or a counselor to learn more about reducing anxiety and stress. He or she may suggest tension reduction techniques, such as: ?Music therapy. Spend time creating or listening to music that you enjoy and that inspires you. ?Mindfulness-based meditation. Practice being aware of your normal breaths while not trying to control your breathing. It can be done while sitting or walking. ?Centering prayer. This involves focusing on a word, phrase, or sacred image that means something to you and brings you peace. ?Deep breathing. To do this, expand your stomach and inhale slowly through your nose. Hold your breath for 3-5 seconds. Then exhale slowly,  letting your stomach muscles relax. ?Self-talk. Learn to notice and identify thought patterns that lead to anxiety reactions and change those patterns to thoughts that feel peaceful. ?Muscle relaxation. Taking time to tense muscles and then relax them. ?Choose a tension reduction technique that fits your lifestyle and personality. These techniques take time and practice. Set aside 5-15 minutes a day to do them. Therapists can offer counseling and training in these techniques. The training to help with anxiety may be covered by some insurance plans. ?Other things you can do to manage stress and anxiety include: ?Keeping a stress diary. This can help you learn what triggers your reaction and then learn ways to manage your response. ?Thinking about how you react to certain situations. You may not be able to control everything, but you can control your response. ?Making time for activities that help you relax and not feeling guilty about spending your time in this way. ?Doing visual imagery. This involves imagining or creating mental pictures to help you relax. ?Practicing yoga. Through yoga poses, you can lower tension and promote relaxation. ? ?Medicines ?Medicines can help ease symptoms. Medicines for anxiety include: ?Antidepressant medicines. These are usually prescribed for long-term daily control. ?Anti-anxiety medicines. These may be added in severe cases, especially when panic attacks occur. ?Medicines will be prescribed by a health care provider. When used together, medicines, psychotherapy, and tension reduction techniques may be the most effective treatment. ?Relationships ?Relationships can play a big part in helping you recover. Try to spend more time connecting with trusted friends and family members. ?Consider going to couples  counseling if you have a partner, taking family education classes, or going to family therapy. ?Therapy can help you and others better understand your condition. ?How to recognize  changes in your anxiety ?Everyone responds differently to treatment for anxiety. Recovery from anxiety happens when symptoms decrease and stop interfering with your daily activities at home or work. This may mean that you will start to: ?Have better concentration and focus. Worry will interfere less in your daily thinking. ?Sleep better. ?Be less irritable. ?Have more energy. ?Have improved memory. ?It is also important to recognize when your condition is getting worse. Contact your health care provider if your symptoms interfere with home or work and you feel like your condition is not improving. ?Follow these instructions at home: ?Activity ?Exercise. Adults should do the following: ?Exercise for at least 150 minutes each week. The exercise should increase your heart rate and make you sweat (moderate-intensity exercise). ?Strengthening exercises at least twice a week. ?Get the right amount and quality of sleep. Most adults need 7-9 hours of sleep each night. ?Lifestyle ? ?Eat a healthy diet that includes plenty of vegetables, fruits, whole grains, low-fat dairy products, and lean protein. ?Do not eat a lot of foods that are high in fats, added sugars, or salt (sodium). ?Make choices that simplify your life. ?Do not use any products that contain nicotine or tobacco. These products include cigarettes, chewing tobacco, and vaping devices, such as e-cigarettes. If you need help quitting, ask your health care provider. ?Avoid caffeine, alcohol, and certain over-the-counter cold medicines. These may make you feel worse. Ask your pharmacist which medicines to avoid. ?General instructions ?Take over-the-counter and prescription medicines only as told by your health care provider. ?Keep all follow-up visits. This is important. ?Where to find support ?You can get help and support from these sources: ?Self-help groups. ?Online and [employer]. ?A trusted spiritual leader. ?Couples counseling. ?Family education  classes. ?Family therapy. ?Where to find more information ?You may find that joining a support group helps you deal with your anxiety. The following sources can help you locate counselors or support groups near you: ?[place]: www.mentalhealth[place]ica.net ?Anxiety and Depression Association of America (ADAA): https://www.clark.net/ ?National Alliance on Mental Illness (NAMI): www.nami.org ?Contact a health care provider if: ?You have a hard time staying focused or finishing daily tasks. ?You spend many hours a day feeling worried about everyday life. ?You become exhausted by worry. ?You start to have headaches or frequently feel tense. ?You develop chronic nausea or diarrhea. ?Get help right away if: ?You have a racing heart and shortness of breath. ?You have thoughts of hurting yourself or others. ?If you ever feel like you may hurt yourself or others, or have thoughts about taking your own life, get help right away. Go to your nearest emergency department or: ?Call your local emergency services (911 in the [place] ?Call a suicide crisis helpline, such as the [facility] at 5805954268 or 988 in the [place]. This is open 24 hours a day in the [place] ?Text the Crisis Text Line at (217) 486-1375 (in the [place].). ?Summary ?Taking steps to learn and use tension reduction techniques can help calm you and help prevent triggering an anxiety reaction. ?When used together, medicines, psychotherapy, and tension reduction techniques may be the most effective treatment. ?Family, friends, and partners can play a big part in supporting you. ?This information is not intended to replace advice given to you by your health care provider. Make sure you discuss any questions you  have with your health care provider. ?Document Revised: 02/19/2021 Document Reviewed: 11/17/2020 ?Elsevier Patient Education ? [place]. ? ?

## 2022-01-14 ENCOUNTER — Encounter: Payer: Medicare Other | Admitting: Family Medicine

## 2022-03-11 ENCOUNTER — Ambulatory Visit (INDEPENDENT_AMBULATORY_CARE_PROVIDER_SITE_OTHER): Payer: Medicare Other | Admitting: Nurse Practitioner

## 2022-03-11 ENCOUNTER — Encounter: Payer: Self-pay | Admitting: Nurse Practitioner

## 2022-03-11 VITALS — BP 122/78 | HR 89 | Temp 97.4°F | Ht 65.5 in | Wt 167.0 lb

## 2022-03-11 DIAGNOSIS — J028 Acute pharyngitis due to other specified organisms: Secondary | ICD-10-CM

## 2022-03-11 MED ORDER — PROMETHAZINE-DM 6.25-15 MG/5ML PO SYRP: 5.0000 mL | ORAL_SOLUTION | Freq: Four times a day (QID) | ORAL | 0 refills | Status: DC | PRN

## 2022-03-11 MED ORDER — AZITHROMYCIN 250 MG PO TABS: ORAL_TABLET | ORAL | 0 refills | Status: AC

## 2022-03-11 NOTE — Patient Instructions (Addendum)
Z-pack as directed  Take Mucinex twice daily  Rest and push fluids Take Promethazine-DM up to 4 times daily as needed for cough   Cough, Adult A cough helps to clear your throat and lungs. A cough may be a sign of an illness or another medical condition. An acute cough may only last 2-3 weeks, while a chronic cough may last 8 or more weeks. Many things can cause a cough. They include: Germs (viruses or bacteria) that attack the airway. Breathing in things that bother (irritate) your lungs. Allergies. Asthma. Mucus that runs down the back of your throat (postnasal drip). Smoking. Acid backing up from the stomach into the tube that moves food from the mouth to the stomach (gastroesophageal reflux). Some medicines. Lung problems. Other medical conditions, such as heart failure or a blood clot in the lung (pulmonary embolism). Follow these instructions at home: Medicines Take over-the-counter and prescription medicines only as told by your doctor. Talk with your doctor before you take medicines that stop a cough (cough suppressants). Lifestyle  Do not smoke, and try not to be around smoke. Do not use any products that contain nicotine or tobacco, such as cigarettes, e-cigarettes, and chewing tobacco. If you need help quitting, ask your doctor. Drink enough fluid to keep your pee (urine) pale yellow. Avoid caffeine. Do not drink alcohol if your doctor tells you not to drink. General instructions  Watch for any changes in your cough. Tell your doctor about them. Always cover your mouth when you cough. Stay away from things that make you cough, such as perfume, candles, campfire smoke, or cleaning products. If the air is dry, use a cool mist vaporizer or humidifier in your home. If your cough is worse at night, try using extra pillows to raise your head up higher while you sleep. Rest as needed. Keep all follow-up visits as told by your doctor. This is important. Contact a doctor  if: You have new symptoms. You cough up pus. Your cough does not get better after 2-3 weeks, or your cough gets worse. Cough medicine does not help your cough and you are not sleeping well. You have pain that gets worse or pain that is not helped with medicine. You have a fever. You are losing weight and you do not know why. You have night sweats. Get help right away if: You cough up blood. You have trouble breathing. Your heartbeat is very fast. These symptoms may be an emergency. Do not wait to see if the symptoms will go away. Get medical help right away. Call your local emergency services (911 in the [place] Do not drive yourself to the hospital. Summary A cough helps to clear your throat and lungs. Many things can cause a cough. Take over-the-counter and prescription medicines only as told by your doctor. Always cover your mouth when you cough. Contact a doctor if you have new symptoms or you have a cough that does not get better or gets worse. This information is not intended to replace advice given to you by your health care provider. Make sure you discuss any questions you have with your health care provider. Document Revised: 09/15/2019 Document Reviewed: 08/15/2018 Elsevier Patient Education  [place].

## 2022-03-11 NOTE — Progress Notes (Signed)
Acute Office Visit  Subjective:    Patient ID: Sandra Rodriguez, female    DOB: [birth year], 66 y.o.   MRN: [id number]  CC: cough  HPI: Patient is in today for Acute Cough She complains of no  fever and productive cough with  clear colored sputum. Denies fever, chills, night sweats, dyspnea, or weight loss. Denies history of GERD or smoking. Onset of symptoms was  2 weeks ago and staying constant.She denies trying any treatments at home. Has not started any new medications. Sometimes worse in the a.m. and when lying down.    Past Medical History:  Diagnosis Date   Anxiety    Arthritis    Cellulitis    Mixed hyperlipidemia    Primary insomnia    Shingles     Past Surgical History:  Procedure Laterality Date   LAPAROSCOPIC OVARIAN CYSTECTOMY     Left wrist surgery     pin placed   WRIST SURGERY      Family History  Problem Relation Age of Onset   Heart disease Mother    Neuromuscular disorder Father    Breast cancer Sister    Heart attack Brother    Diabetes Brother     Social History   Socioeconomic History   Marital status: Single    Spouse name: Not on file   Number of children: Not on file   Years of education: Not on file   Highest education level: Not on file  Occupational History   Not on file  Tobacco Use   Smoking status: Never   Smokeless tobacco: Never  Vaping Use   Vaping Use: Never used  Substance and Sexual Activity   Alcohol use: No   Drug use: No   Sexual activity: Not on file  Other Topics Concern   Not on file  Social History Narrative   Not on file   Social Determinants of Health   Financial Resource Strain: Not on file  Food Insecurity: Not on file  Transportation Needs: Not on file  Physical Activity: Not on file  Stress: Not on file  Social Connections: Not on file  Intimate Partner Violence: Not on file    Outpatient Medications Prior to Visit  Medication Sig Dispense Refill   LORazepam (ATIVAN) 0.5 MG tablet Take 1  tablet (0.5 mg total) by mouth 2 (two) times daily as needed for anxiety. 60 tablet 0   multivitamin-lutein (OCUVITE-LUTEIN) CAPS capsule Take 1 capsule by mouth daily.     Omega-3 Fatty Acids (FISH OIL) 1200 MG CPDR Take by mouth.     traZODone (DESYREL) 100 MG tablet TAKE 1 TABLET BY MOUTH EVERY DAY AT BEDTIME AS NEEDED FOR SLEEP 90 tablet 0   No facility-administered medications prior to visit.    Allergies  Allergen Reactions   Ibuprofen Anaphylaxis and Rash   Meloxicam Nausea Only   Onion Nausea Only    Review of Systems See pertinent positives and negatives per HPI.     Objective:    Physical Exam Vitals reviewed.  Constitutional:      Appearance: Normal appearance.  HENT:     Head: Normocephalic.     Right Ear: Tympanic membrane normal.     Left Ear: Tympanic membrane normal.     Nose: Congestion present.     Mouth/Throat:     Mouth: Mucous membranes are dry.     Pharynx: Posterior oropharyngeal erythema present.  Eyes:     Pupils: Pupils are equal, round, and reactive  to light.  Cardiovascular:     Rate and Rhythm: Normal rate and regular rhythm.  Pulmonary:     Effort: Pulmonary effort is normal.     Breath sounds: Normal breath sounds.  Musculoskeletal:     Cervical back: Tenderness present.  Skin:    General: Skin is warm and dry.  Neurological:     Mental Status: She is alert.     BP 122/78   Pulse 89   Temp (!) 97.4 F (36.3 C)   Ht 5' 5.5" (1.664 m)   Wt 167 lb (75.8 kg)   SpO2 93%   BMI 27.37 kg/m   Wt Readings from Last 3 Encounters:  12/01/21 169 lb 12.8 oz (77 kg)  06/30/21 165 lb (74.8 kg)  02/20/21 171 lb (77.6 kg)    Health Maintenance Due  Topic Date Due   TETANUS/TDAP  Never done   PAP SMEAR-Modifier  Never done   COLONOSCOPY (Pts 45-28yr Insurance coverage will need to be confirmed)  Never done   Zoster Vaccines- Shingrix (2 of 2) 12/03/2017   COVID-19 Vaccine (4 - Moderna series) 09/23/2020   Pneumonia Vaccine 65+ Years  old (1 - PCV) Never done   DEXA SCAN  Never done   INFLUENZA VACCINE  03/10/2022    Lab Results  Component Value Date   TSH 1.660 09/26/2020   Lab Results  Component Value Date   WBC 9.8 09/26/2020   HGB 13.8 09/26/2020   HCT 40.9 09/26/2020   MCV 95 09/26/2020   PLT 431 09/26/2020   Lab Results  Component Value Date   NA 137 09/26/2020   K 4.6 09/26/2020   CO2 21 09/26/2020   GLUCOSE 86 09/26/2020   BUN 16 09/26/2020   CREATININE 0.61 09/26/2020   BILITOT 0.2 09/26/2020   ALKPHOS 126 (H) 09/26/2020   AST 19 09/26/2020   ALT 17 09/26/2020   PROT 6.9 09/26/2020   ALBUMIN 4.0 09/26/2020   CALCIUM 9.1 09/26/2020   ANIONGAP 8 09/05/2017   Lab Results  Component Value Date   CHOL 187 09/26/2020   Lab Results  Component Value Date   HDL 43 09/26/2020   Lab Results  Component Value Date   LDLCALC 124 (H) 09/26/2020   Lab Results  Component Value Date   TRIG 108 09/26/2020   Lab Results  Component Value Date   CHOLHDL 4.3 09/26/2020        Assessment & Plan:   1. Pharyngitis due to other organism - azithromycin (ZITHROMAX) 250 MG tablet; Take 2 tablets on day 1, then 1 tablet daily on days 2 through 5  Dispense: 6 tablet; Refill: 0 - promethazine-dextromethorphan (PROMETHAZINE-DM) 6.25-15 MG/5ML syrup; Take 5 mLs by mouth 4 (four) times daily as needed.  Dispense: 118 mL; Refill: 0   Z-pack as directed  Take Mucinex twice daily  Rest and push fluids Take Promethazine-DM up to 4 times daily as needed for cough    Follow-up: PRN  An After Visit Summary was printed and given to the patient.  I, SRip Harbour NP, have reviewed all documentation for this visit. The documentation on 03/11/22 for the exam, diagnosis, procedures, and orders are all accurate and complete.    Signed, SRip Harbour NP CB[phone]

## 2022-09-15 NOTE — Progress Notes (Unsigned)
Subjective:  Patient ID: Sandra Rodriguez, female    DOB: 10-23-1955  Age: 67 y.o. MRN: [id number]  Chief Complaints: Follow up  for insomnia History of Present illness:  Sandra Rodriguez is a 67 year old Caucasian female that presents for follow-up of generalized anxiety disorder and insomnia. Francena tells that she has experienced increased stress in her life recently due to her family issues. Most of her good nights she sleeps for 5 hours. In past she has been referred to sleep clinic and she thinks nothing is working for her besides ativan. She takes ativan only as needed, sometimes she takes two times a week. Refused to take SSRI/SNRIs, said nothing works for her and she hates to take medicines everyday. She failed trazadone to sleep before. She is very much convinced that she is not addictive to Lorazepam and takes only as needed.  Patient is retired from [employer], patient is doing water aerobics 5 times in a week for her exercise.     09/16/2022    9:51 AM 12/01/2021    3:03 PM 09/25/2020    2:39 PM  Depression screen PHQ 2/9  Decreased Interest 0 0 0  Down, Depressed, Hopeless 0 0 0  PHQ - 2 Score 0 0 0  Altered sleeping  3 3  Tired, decreased energy  1 1  Change in appetite  0 0  Feeling bad or failure about yourself   0 0  Trouble concentrating  0 1  Moving slowly or fidgety/restless  0 0  Suicidal thoughts   0  PHQ-9 Score  4 5  Difficult doing work/chores  Not difficult at all Not difficult at all       09/16/2022    9:52 AM 12/01/2021    3:05 PM 09/25/2020    6:57 PM  GAD 7 : Generalized Anxiety Score  Nervous, Anxious, on Edge '1 1 1  '$ Control/stop worrying 0 1 0  Worry too much - different things 1 1 0  Trouble relaxing 0 1 3  Restless 0 0 0  Easily annoyed or irritable 0 0 0  Afraid - awful might happen 0 0 0  Total GAD 7 Score '2 4 4  '$ Anxiety Difficulty Not difficult at all Not difficult at all Not difficult at all       Current Outpatient Medications on File Prior to Visit   Medication Sig Dispense Refill   multivitamin-lutein (OCUVITE-LUTEIN) CAPS capsule Take 1 capsule by mouth daily.     Omega-3 Fatty Acids (FISH OIL) 1200 MG CPDR Take by mouth.     promethazine-dextromethorphan (PROMETHAZINE-DM) 6.25-15 MG/5ML syrup Take 5 mLs by mouth 4 (four) times daily as needed. (Patient not taking: Reported on 09/16/2022) 118 mL 0   No current facility-administered medications on file prior to visit.   Past Medical History:  Diagnosis Date   Anxiety    Arthritis    Cellulitis    Mixed hyperlipidemia    Primary insomnia    Shingles    Past Surgical History:  Procedure Laterality Date   LAPAROSCOPIC OVARIAN CYSTECTOMY     Left wrist surgery     pin placed   WRIST SURGERY      Family History  Problem Relation Age of Onset   Heart disease Mother    Neuromuscular disorder Father    Breast cancer Sister    Heart attack Brother    Diabetes Brother    Social History   Socioeconomic History   Marital status: Single  Spouse name: Not on file   Number of children: Not on file   Years of education: Not on file   Highest education level: Not on file  Occupational History   Not on file  Tobacco Use   Smoking status: Never   Smokeless tobacco: Never  Vaping Use   Vaping Use: Never used  Substance and Sexual Activity   Alcohol use: No   Drug use: No   Sexual activity: Not on file  Other Topics Concern   Not on file  Social History Narrative   Not on file   Social Determinants of Health   Financial Resource Strain: Low Risk  (03/11/2022)   Overall Financial Resource Strain (CARDIA)    Difficulty of Paying Living Expenses: Not hard at all  Food Insecurity: No Food Insecurity (03/11/2022)   Hunger Vital Sign    Worried About Running Out of Food in the Last Year: Never true    [place] in the Last Year: Never true  Transportation Needs: No Transportation Needs (03/11/2022)   PRAPARE - Hydrologist (Medical): No     Lack of Transportation (Non-Medical): No  Physical Activity: Sufficiently Active (03/11/2022)   Exercise Vital Sign    Days of Exercise per Week: 5 days    Minutes of Exercise per Session: 30 min  Stress: No Stress Concern Present (03/11/2022)   [place]    Feeling of Stress : Not at all  Social Connections: Moderately Isolated (03/11/2022)   Social Connection and Isolation Panel [NHANES]    Frequency of Communication with Friends and Family: More than three times a week    Frequency of Social Gatherings with Friends and Family: More than three times a week    Attends Religious Services: More than 4 times per year    Active Member of [employer] or Organizations: No    Attends Archivist Meetings: Never    Marital Status: Divorced    Review of Systems  Constitutional:  Negative for chills, fatigue and fever.  HENT:  Negative for congestion, rhinorrhea and sore throat.   Respiratory:  Negative for cough and shortness of breath.   Cardiovascular:  Negative for chest pain.  Gastrointestinal:  Negative for abdominal pain, constipation, diarrhea, nausea and vomiting.  Genitourinary:  Negative for dysuria and urgency.  Musculoskeletal:  Positive for arthralgias (bilateral knee discomfort, water aerobics helping it). Negative for back pain and myalgias.  Neurological:  Negative for dizziness, weakness, light-headedness and headaches.  Psychiatric/Behavioral:  Positive for sleep disturbance. Negative for dysphoric mood. The patient is not nervous/anxious.      Objective:  BP 116/72   Pulse 72   Temp (!) 97.3 F (36.3 C)   Resp 16   Ht '5\' 5"'$  (1.651 m)   Wt 166 lb (75.3 kg)   BMI 27.62 kg/m      09/16/2022    9:50 AM 03/11/2022    2:45 PM 12/01/2021    2:59 PM  BP/Weight  Systolic BP 161 096 045  Diastolic BP 72 78 66  Wt. (Lbs) 166 167 169.8  BMI 27.62 kg/m2 27.37 kg/m2 28.26 kg/m2    Physical Exam Vitals  reviewed.  Constitutional:      Appearance: Normal appearance.  Neck:     Vascular: No carotid bruit.  Cardiovascular:     Rate and Rhythm: Normal rate and regular rhythm.     Heart sounds: Normal heart sounds.  Pulmonary:     Effort: Pulmonary effort is normal.     Breath sounds: Normal breath sounds.  Abdominal:     General: Bowel sounds are normal.     Palpations: Abdomen is soft.     Tenderness: There is no abdominal tenderness.  Musculoskeletal:        General: No swelling, tenderness, deformity or signs of injury.     Right lower leg: No edema.     Left lower leg: No edema.  Neurological:     Mental Status: She is alert and oriented to person, place, and time.  Psychiatric:        Mood and Affect: Mood normal.        Behavior: Behavior normal.        Thought Content: Thought content normal.        Judgment: Judgment normal.        09/16/2022    9:51 AM 12/01/2021    3:03 PM 09/25/2020    2:39 PM  Depression screen PHQ 2/9  Decreased Interest 0 0 0  Down, Depressed, Hopeless 0 0 0  PHQ - 2 Score 0 0 0  Altered sleeping  3 3  Tired, decreased energy  1 1  Change in appetite  0 0  Feeling bad or failure about yourself   0 0  Trouble concentrating  0 1  Moving slowly or fidgety/restless  0 0  Suicidal thoughts   0  PHQ-9 Score  4 5  Difficult doing work/chores  Not difficult at all Not difficult at all       09/18/2019    9:17 AM 09/25/2020    2:39 PM 03/11/2022    2:47 PM  Fall Risk  Falls in the past year? 0 0 0  Was there an injury with Fall?  0 0  Fall Risk Category Calculator  0 0  Fall Risk Category (Retired)  Low Low  (RETIRED) Patient Fall Risk Level  Low fall risk Low fall risk  Patient at Risk for Falls Due to   No Fall Risks  Fall risk Follow up   Falls evaluation completed    Lab Results  Component Value Date   WBC 9.8 09/26/2020   HGB 13.8 09/26/2020   HCT 40.9 09/26/2020   PLT 431 09/26/2020   GLUCOSE 86 09/26/2020   CHOL 187 09/26/2020    TRIG 108 09/26/2020   HDL 43 09/26/2020   LDLCALC 124 (H) 09/26/2020   ALT 17 09/26/2020   AST 19 09/26/2020   NA 137 09/26/2020   K 4.6 09/26/2020   CL 100 09/26/2020   CREATININE 0.61 09/26/2020   BUN 16 09/26/2020   CO2 21 09/26/2020   TSH 1.660 09/26/2020    Assessment & Plan:   Mixed hyperlipidemia -     Lipid panel -     CBC with Differential/Platelet -     Comprehensive metabolic panel -     TSH  GAD (generalized anxiety disorder) -     LORazepam; Take 1 tablet (0.5 mg total) by mouth 2 (two) times daily as needed for anxiety.  Dispense: 30 tablet; Refill: 0  Adjustment insomnia Assessment & Plan: Continue Lorazepam 0.5 mg only as needed Verbalized understanding of addiction of long term taking Lorazepam, plan to titre down the road Sleep hygiene discussed with patient  Continue water aerobics exercise and walking to dog Stick with healthy eating habits   Elevated LDL cholesterol level Assessment & Plan: Will check lipid profile  today and go from there Not currently on any medicines   Generalized anxiety disorder Assessment & Plan: GAD 2 PHQ 0 Well controlled Refused to be on any SSRI/SNRIs this time    Follow-up: Return in about 4 months (around 01/15/2023) for FASTING, CHRONIC, CPE.  I, Eron Goble have reviewed all documentation for this visit. The documentation on 09/16/22   for the exam, diagnosis, procedures, and orders are all accurate and complete.     An After Visit Summary was printed and given to the patient.  Neil Crouch, DNP, [place] [phone]

## 2022-09-16 ENCOUNTER — Ambulatory Visit (INDEPENDENT_AMBULATORY_CARE_PROVIDER_SITE_OTHER): Payer: Medicare Other | Admitting: Nurse Practitioner

## 2022-09-16 ENCOUNTER — Encounter: Payer: Self-pay | Admitting: Nurse Practitioner

## 2022-09-16 VITALS — BP 116/72 | HR 72 | Temp 97.3°F | Resp 16 | Ht 65.0 in | Wt 166.0 lb

## 2022-09-16 DIAGNOSIS — E782 Mixed hyperlipidemia: Secondary | ICD-10-CM

## 2022-09-16 DIAGNOSIS — E7800 Pure hypercholesterolemia, unspecified: Secondary | ICD-10-CM | POA: Diagnosis not present

## 2022-09-16 DIAGNOSIS — F411 Generalized anxiety disorder: Secondary | ICD-10-CM | POA: Diagnosis not present

## 2022-09-16 DIAGNOSIS — F5102 Adjustment insomnia: Secondary | ICD-10-CM | POA: Diagnosis not present

## 2022-09-16 MED ORDER — LORAZEPAM 0.5 MG PO TABS: 0.5000 mg | ORAL_TABLET | Freq: Two times a day (BID) | ORAL | 0 refills | Status: DC | PRN

## 2022-09-16 NOTE — Assessment & Plan Note (Signed)
Will check lipid profile today and go from there Not currently on any medicines

## 2022-09-16 NOTE — Assessment & Plan Note (Signed)
Continue Lorazepam 0.5 mg only as needed Verbalized understanding of addiction of long term taking Lorazepam, plan to titre down the road Sleep hygiene discussed with patient  Continue water aerobics exercise and walking to dog Stick with healthy eating habits

## 2022-09-16 NOTE — Assessment & Plan Note (Signed)
GAD 2 PHQ 0 Well controlled Refused to be on any SSRI/SNRIs this time

## 2022-09-16 NOTE — Patient Instructions (Signed)
Dyslipidemia Dyslipidemia is an imbalance of waxy, fat-like substances (lipids) in the blood. The body needs lipids in small amounts. Dyslipidemia often involves a high level of cholesterol or triglycerides, which are types of lipids. Common forms of dyslipidemia include: High levels of LDL cholesterol. LDL is the type of cholesterol that causes fatty deposits (plaques) to build up in the blood vessels that carry blood away from the heart (arteries). Low levels of HDL cholesterol. HDL cholesterol is the type of cholesterol that protects against heart disease. High levels of HDL remove the LDL buildup from arteries. High levels of triglycerides. Triglycerides are a fatty substance in the blood that is linked to a buildup of plaques in the arteries. What are the causes? There are two main types of dyslipidemia: primary and secondary. Primary dyslipidemia is caused by changes (mutations) in genes that are passed down through families (inherited). These mutations cause several types of dyslipidemia. Secondary dyslipidemia may be caused by various risk factors that can lead to the disease, such as lifestyle choices and certain medical conditions. What increases the risk? You are more likely to develop this condition if you are an older man or if you are a woman who has gone through menopause. Other risk factors include: Having a family history of dyslipidemia. Taking certain medicines, including birth control pills, steroids, some diuretics, and beta-blockers. Eating a diet high in saturated fat. Smoking cigarettes or excessive alcohol intake. Having certain medical conditions such as diabetes, polycystic ovary syndrome (PCOS), kidney disease, liver disease, or hypothyroidism. Not exercising regularly. Being overweight or obese with too much belly fat. What are the signs or symptoms? In most cases, dyslipidemia does not usually cause any symptoms. In severe cases, very high lipid levels can  cause: Fatty bumps under the skin (xanthomas). A white or gray ring around the black center (pupil) of the eye. Very high triglyceride levels can cause inflammation of the pancreas (pancreatitis). How is this diagnosed? Your health care provider may diagnose dyslipidemia based on a routine blood test (fasting blood test). Because most people do not have symptoms of the condition, this blood testing (lipid profile) is done on adults age 20 and older and is repeated every 4-6 years. This test checks: Total cholesterol. This measures the total amount of cholesterol in your blood, including LDL cholesterol, HDL cholesterol, and triglycerides. A healthy number is below 200 mg/dL (5.17 mmol/L). LDL cholesterol. The target number for LDL cholesterol is different for each person, depending on individual risk factors. A healthy number is usually below 100 mg/dL (2.59 mmol/L). Ask your health care provider what your LDL cholesterol should be. HDL cholesterol. An HDL level of 60 mg/dL (1.55 mmol/L) or higher is best because it helps to protect against heart disease. A number below 40 mg/dL (1.03 mmol/L) for men or below 50 mg/dL (1.29 mmol/L) for women increases the risk for heart disease. Triglycerides. A healthy triglyceride number is below 150 mg/dL (1.69 mmol/L). If your lipid profile is abnormal, your health care provider may do other blood tests. How is this treated? Treatment depends on the type of dyslipidemia that you have and your other risk factors for heart disease and stroke. Your health care provider will have a target range for your lipid levels based on this information. Treatment for dyslipidemia starts with lifestyle changes, such as diet and exercise. Your health care provider may recommend that you: Get regular exercise. Make changes to your diet. Quit smoking if you smoke. Limit your alcohol intake. If diet   changes and exercise do not help you reach your goals, your health care provider  may also prescribe medicine to lower lipids. The most commonly prescribed type of medicine lowers your LDL cholesterol (statin drug). If you have a high triglyceride level, your provider may prescribe another type of drug (fibrate) or an omega-3 fish oil supplement, or both. Follow these instructions at home: Eating and drinking  Follow instructions from your health care provider or dietitian about eating or drinking restrictions. Eat a healthy diet as told by your health care provider. This can help you reach and maintain a healthy weight, lower your LDL cholesterol, and raise your HDL cholesterol. This may include: Limiting your calories, if you are overweight. Eating more fruits, vegetables, whole grains, fish, and lean meats. Limiting saturated fat, trans fat, and cholesterol. Do not drink alcohol if: Your health care provider tells you not to drink. You are pregnant, may be pregnant, or are planning to become pregnant. If you drink alcohol: Limit how much you have to: 0-1 drink a day for women. 0-2 drinks a day for men. Know how much alcohol is in your drink. In the [place] one drink equals one 12 oz bottle of beer (355 mL), one 5 oz glass of wine (148 mL), or one 1 oz glass of hard liquor (44 mL). Activity Get regular exercise. Start an exercise and strength training program as told by your health care provider. Ask your health care provider what activities are safe for you. Your health care provider may recommend: 30 minutes of aerobic activity 4-6 days a week. Brisk walking is an example of aerobic activity. Strength training 2 days a week. General instructions Do not use any products that contain nicotine or tobacco. These products include cigarettes, chewing tobacco, and vaping devices, such as e-cigarettes. If you need help quitting, ask your health care provider. Take over-the-counter and prescription medicines only as told by your health care provider. This includes  supplements. Keep all follow-up visits. This is important. Contact a health care provider if: You are having trouble sticking to your exercise or diet plan. You are struggling to quit smoking or to control your use of alcohol. Summary Dyslipidemia often involves a high level of cholesterol or triglycerides, which are types of lipids. Treatment depends on the type of dyslipidemia that you have and your other risk factors for heart disease and stroke. Treatment for dyslipidemia starts with lifestyle changes, such as diet and exercise. Your health care provider may prescribe medicine to lower lipids. This information is not intended to replace advice given to you by your health care provider. Make sure you discuss any questions you have with your health care provider. Document Revised: 02/27/2022 Document Reviewed: 09/30/2020 Elsevier Patient Education  2023 [employer]  

## 2022-09-17 LAB — COMPREHENSIVE METABOLIC PANEL
ALT: 10 IU/L (ref 0–32)
AST: 13 IU/L (ref 0–40)
Albumin/Globulin Ratio: 1.4 (ref 1.2–2.2)
Albumin: 4.2 g/dL (ref 3.9–4.9)
Alkaline Phosphatase: 120 IU/L (ref 44–121)
BUN/Creatinine Ratio: 29 — ABNORMAL HIGH (ref 12–28)
BUN: 17 mg/dL (ref 8–27)
Bilirubin Total: 0.2 mg/dL (ref 0.0–1.2)
CO2: 21 mmol/L (ref 20–29)
Calcium: 9.3 mg/dL (ref 8.7–10.3)
Chloride: 102 mmol/L (ref 96–106)
Creatinine, Ser: 0.58 mg/dL (ref 0.57–1.00)
Globulin, Total: 3.1 g/dL (ref 1.5–4.5)
Glucose: 83 mg/dL (ref 70–99)
Potassium: 4.5 mmol/L (ref 3.5–5.2)
Sodium: 139 mmol/L (ref 134–144)
Total Protein: 7.3 g/dL (ref 6.0–8.5)
eGFR: 100 mL/min/{1.73_m2} (ref >59)

## 2022-09-17 LAB — CBC WITH DIFFERENTIAL/PLATELET
Basophils Absolute: 0 10*3/uL (ref 0.0–0.2)
Basos: 0 %
EOS (ABSOLUTE): 0 10*3/uL (ref 0.0–0.4)
Eos: 0 %
Hematocrit: 39.7 % (ref 34.0–46.6)
Hemoglobin: 13.8 g/dL (ref 11.1–15.9)
Immature Grans (Abs): 0 10*3/uL (ref 0.0–0.1)
Immature Granulocytes: 0 %
Lymphocytes Absolute: 6.4 10*3/uL — ABNORMAL HIGH (ref 0.7–3.1)
Lymphs: 65 %
MCH: 32 pg (ref 26.6–33.0)
MCHC: 34.8 g/dL (ref 31.5–35.7)
MCV: 92 fL (ref 79–97)
Monocytes Absolute: 0.8 10*3/uL (ref 0.1–0.9)
Monocytes: 8 %
Neutrophils Absolute: 2.6 10*3/uL (ref 1.4–7.0)
Neutrophils: 27 %
Platelets: 396 10*3/uL (ref 150–450)
RBC: 4.31 x10E6/uL (ref 3.77–5.28)
RDW: 12.1 % (ref 11.7–15.4)
WBC: 9.9 10*3/uL (ref 3.4–10.8)

## 2022-09-17 LAB — LIPID PANEL
Chol/HDL Ratio: 4.2 ratio (ref 0.0–4.4)
Cholesterol, Total: 208 mg/dL — ABNORMAL HIGH (ref 100–199)
HDL: 49 mg/dL (ref >39)
LDL Chol Calc (NIH): 143 mg/dL — ABNORMAL HIGH (ref 0–99)
Triglycerides: 90 mg/dL (ref 0–149)
VLDL Cholesterol Cal: 16 mg/dL (ref 5–40)

## 2022-09-17 LAB — TSH: TSH: 1.69 u[IU]/mL (ref 0.450–4.500)

## 2022-09-17 LAB — CARDIOVASCULAR RISK ASSESSMENT

## 2024-01-27 ENCOUNTER — Encounter: Payer: Self-pay | Admitting: Family Medicine

## 2024-01-27 ENCOUNTER — Ambulatory Visit (INDEPENDENT_AMBULATORY_CARE_PROVIDER_SITE_OTHER): Admitting: Family Medicine

## 2024-01-27 VITALS — BP 138/70 | HR 85 | Temp 97.6°F | Resp 14 | Ht 65.0 in | Wt 157.0 lb

## 2024-01-27 DIAGNOSIS — F411 Generalized anxiety disorder: Secondary | ICD-10-CM

## 2024-01-27 DIAGNOSIS — C44719 Basal cell carcinoma of skin of left lower limb, including hip: Secondary | ICD-10-CM

## 2024-01-27 DIAGNOSIS — R0781 Pleurodynia: Secondary | ICD-10-CM

## 2024-01-27 DIAGNOSIS — E782 Mixed hyperlipidemia: Secondary | ICD-10-CM | POA: Diagnosis not present

## 2024-01-27 MED ORDER — LORAZEPAM 0.5 MG PO TABS: 0.5000 mg | ORAL_TABLET | Freq: Two times a day (BID) | ORAL | 0 refills | Status: AC | PRN

## 2024-01-27 NOTE — Progress Notes (Incomplete)
 Subjective:  Patient ID: Sandra Rodriguez, female    DOB: 11/24/1955  Age: 68 y.o. MRN: [id number]  Chief Complaint  Patient presents with  . Insomnia    Discussed the use of AI scribe software for clinical note transcription with the patient, who gave verbal consent to proceed.  History of Present Illness   Sandra Rodriguez is a 68 year old female who presents with insomnia and chronic rib pain.  She experiences insomnia and uses medication as needed to aid sleep. She describes herself as a 'very fidgety person' with a quick mind, which sometimes makes relaxation and sleep difficult. She occasionally wakes at 2 AM and struggles to return to sleep.  She has had rib pain for the past ten years, primarily on the right side, worsening with deep breaths. The pain feels as if her ribs are 'attached to her lungs' and 'pull apart' when lying down, causing significant discomfort. Despite extensive workup, including cardiology and sports medicine evaluations and numerous x-rays, no definitive cause has been identified. She associates the onset of symptoms with a shingles vaccination, having previously experienced shingles, which she found 'awful'.  She experiences anxiety, which she feels may be related to her rib pain. Physical exertion, such as walking uphill, makes it difficult to catch her breath and exacerbates the pain. She mentions walking her dog as part of her routine physical activity.  She has a recent diagnosis of basal cell carcinoma on her left leg, identified after a persistent sore was tested. She is awaiting further treatment for this condition.         01/27/2024    3:36 PM 09/16/2022    9:51 AM 12/01/2021    3:03 PM 09/25/2020    2:39 PM  Depression screen PHQ 2/9  Decreased Interest 0 0 0 0  Down, Depressed, Hopeless 0 0 0 0  PHQ - 2 Score 0 0 0 0  Altered sleeping 3  3 3   Tired, decreased energy 1  1 1   Change in appetite 0  0 0  Feeling bad or failure about yourself  0  0 0   Trouble concentrating 0  0 1  Moving slowly or fidgety/restless 0  0 0  Suicidal thoughts 0   0  PHQ-9 Score 4  4 5   Difficult doing work/chores Not difficult at all  Not difficult at all Not difficult at all        01/27/2024    3:38 PM  Fall Risk   Falls in the past year? 0  Number falls in past yr: 0  Injury with Fall? 0  Risk for fall due to : No Fall Risks  Follow up Falls evaluation completed    Patient Care Team: Janece Means, FNP as PCP - General (Family Medicine)   Review of Systems  Constitutional:  Negative for chills, fatigue and fever.  HENT:  Negative for congestion, ear pain and sore throat.   Respiratory:  Negative for cough and shortness of breath.   Cardiovascular:  Negative for chest pain and palpitations.  Gastrointestinal:  Negative for abdominal pain, constipation, diarrhea, nausea and vomiting.  Endocrine: Negative for polydipsia, polyphagia and polyuria.  Genitourinary:  Negative for difficulty urinating and dysuria.  Musculoskeletal:  Negative for arthralgias, back pain and myalgias.  Skin:  Negative for rash.  Neurological:  Negative for headaches.  Psychiatric/Behavioral:  Positive for sleep disturbance. Negative for dysphoric mood. The patient is nervous/anxious.     Current Outpatient Medications on File  Prior to Visit  Medication Sig Dispense Refill  . LORazepam  (ATIVAN ) 0.5 MG tablet Take 1 tablet (0.5 mg total) by mouth 2 (two) times daily as needed for anxiety. 30 tablet 0  . Multiple Vitamin (MULTIVITAMIN WITH MINERALS) TABS tablet Take 1 tablet by mouth daily.    . multivitamin-lutein (OCUVITE-LUTEIN) CAPS capsule Take 1 capsule by mouth daily.    . Omega-3 Fatty Acids (FISH OIL) 1200 MG CPDR Take by mouth.    . traZODone  (DESYREL ) 50 MG tablet as needed.     No current facility-administered medications on file prior to visit.   Past Medical History:  Diagnosis Date  . Anxiety   . Arthritis   . Cellulitis   . Mixed hyperlipidemia    . Primary insomnia   . Shingles    Past Surgical History:  Procedure Laterality Date  . LAPAROSCOPIC OVARIAN CYSTECTOMY    . Left wrist surgery     pin placed  . WRIST SURGERY      Family History  Problem Relation Age of Onset  . Heart disease Mother   . Neuromuscular disorder Father   . Breast cancer Sister   . Heart attack Brother   . Diabetes Brother    Social History   Socioeconomic History  . Marital status: Single    Spouse name: Not on file  . Number of children: Not on file  . Years of education: Not on file  . Highest education level: Not on file  Occupational History  . Not on file  Tobacco Use  . Smoking status: Never  . Smokeless tobacco: Never  Vaping Use  . Vaping status: Never Used  Substance and Sexual Activity  . Alcohol use: No  . Drug use: No  . Sexual activity: Not on file  Other Topics Concern  . Not on file  Social History Narrative  . Not on file   Social Drivers of Health   Financial Resource Strain: Low Risk  (03/11/2022)   Overall Financial Resource Strain (CARDIA)   . Difficulty of Paying Living Expenses: Not hard at all  Food Insecurity: No Food Insecurity (03/11/2022)   Hunger Vital Sign   . Worried About Programme researcher, broadcasting/film/video in the Last Year: Never true   . Ran Out of Food in the Last Year: Never true  Transportation Needs: No Transportation Needs (03/11/2022)   PRAPARE - Transportation   . Lack of Transportation (Medical): No   . Lack of Transportation (Non-Medical): No  Physical Activity: Sufficiently Active (03/11/2022)   Exercise Vital Sign   . Days of Exercise per Week: 5 days   . Minutes of Exercise per Session: 30 min  Stress: No Stress Concern Present (03/11/2022)   [employer] of Occupational Health - Occupational Stress Questionnaire   . Feeling of Stress : Not at all  Social Connections: Moderately Isolated (03/11/2022)   Social Connection and Isolation Panel   . Frequency of Communication with Friends and Family:  More than three times a week   . Frequency of Social Gatherings with Friends and Family: More than three times a week   . Attends Religious Services: More than 4 times per year   . Active Member of Clubs or Organizations: No   . Attends Banker Meetings: Never   . Marital Status: Divorced    Objective:  BP 138/70   Pulse 85   Temp 97.6 F (36.4 C)   Resp 14   Ht 5' 5 (1.651 m)  Wt 157 lb (71.2 kg)   SpO2 98%   BMI 26.13 kg/m      01/27/2024    3:30 PM 09/16/2022    9:50 AM 03/11/2022    2:45 PM  BP/Weight  Systolic BP 138 116 122  Diastolic BP 70 72 78  Wt. (Lbs) 157 166 167  BMI 26.13 kg/m2 27.62 kg/m2 27.37 kg/m2    Physical Exam Vitals reviewed.  Constitutional:      General: She is not in acute distress.    Appearance: Normal appearance.   Eyes:     Conjunctiva/sclera: Conjunctivae normal.    Cardiovascular:     Rate and Rhythm: Normal rate and regular rhythm.     Heart sounds: Normal heart sounds. No murmur heard. Pulmonary:     Effort: Pulmonary effort is normal.     Breath sounds: Normal breath sounds. No wheezing.  Abdominal:     General: Bowel sounds are normal.   Neurological:     Mental Status: She is alert. Mental status is at baseline.   Psychiatric:        Mood and Affect: Mood normal.        Behavior: Behavior normal.      Lab Results  Component Value Date   WBC 9.9 09/16/2022   HGB 13.8 09/16/2022   HCT 39.7 09/16/2022   PLT 396 09/16/2022   GLUCOSE 83 09/16/2022   CHOL 208 (H) 09/16/2022   TRIG 90 09/16/2022   HDL 49 09/16/2022   LDLCALC 143 (H) 09/16/2022   ALT 10 09/16/2022   AST 13 09/16/2022   NA 139 09/16/2022   K 4.5 09/16/2022   CL 102 09/16/2022   CREATININE 0.58 09/16/2022   BUN 17 09/16/2022   CO2 21 09/16/2022   TSH 1.690 09/16/2022      Assessment & Plan:  GAD (generalized anxiety disorder)     No orders of the defined types were placed in this encounter.   No orders of the defined  types were placed in this encounter.  Assessment and Plan    Basal cell carcinoma Recently diagnosed on the leg, confirmed. Awaiting management plan, possibly Mohs surgery. - Await further management plan, possibly including Mohs surgery.  Chronic rib pain Chronic right-sided rib pain for ten years, no clear cause identified. Onset correlated with shingles vaccination. Anxiety may contribute. Discussed magnesium for relaxation and anxiety. - Consider magnesium supplementation at night to aid relaxation and anxiety. - Ensure correct form of magnesium to avoid diarrhea.  Insomnia Chronic insomnia managed with medication as needed. Medication aids relaxation and sleep. Discussed melatonin side effects, advised against use. - Refill insomnia medication at [facility].  General Health Maintenance Blood work not done since February. Triglycerides disregarded due to non-fasting. No hypertriglyceridemia history. Discussed vitamins and supplements for relaxation and sleep. - Order blood work, disregarding triglycerides due to non-fasting state. - Recommend magnesium supplementation at night, ensuring the correct form to avoid diarrhea.        Follow-up: No follow-ups on file.   Gladys Lamp I Leal-Borjas,acting as a scribe for Janece Means, FNP.,have documented all relevant documentation on the behalf of Janece Means, FNP,as directed by  Janece Means, FNP while in the presence of Janece Means, FNP.   An After Visit Summary was printed and given to the patient.  Janece Means, FNP [facility] [phone]

## 2024-01-27 NOTE — Patient Instructions (Addendum)
 Best for Sleep - Magnesium Glycinate

## 2024-01-27 NOTE — Progress Notes (Signed)
 Subjective:  Patient ID: Sandra Rodriguez, female    DOB: 03-29-1956  Age: 68 y.o. MRN: [id number]  Chief Complaint  Patient presents with   Insomnia    Discussed the use of AI scribe software for clinical note transcription with the patient, who gave verbal consent to proceed.  History of Present Illness   Sandra Rodriguez is a 68 year old female who presents with insomnia and chronic rib pain.  She experiences insomnia and uses medication as needed to aid sleep. She describes herself as a 'very fidgety person' with a quick mind, which sometimes makes relaxation and sleep difficult. She occasionally wakes at 2 AM and struggles to return to sleep.  She has had rib pain for the past ten years, primarily on the right side, worsening with deep breaths. The pain feels as if her ribs are 'attached to her lungs' and 'pull apart' when lying down, causing significant discomfort. Despite extensive workup, including cardiology and sports medicine evaluations and numerous x-rays, no definitive cause has been identified. She associates the onset of symptoms with a shingles vaccination, having previously experienced shingles, which she found 'awful'.  She experiences anxiety, which she feels may be related to her rib pain. Physical exertion, such as walking uphill, makes it difficult to catch her breath and exacerbates the pain. She mentions walking her dog as part of her routine physical activity.  She has a recent diagnosis of basal cell carcinoma on her leg, identified after a persistent sore was tested. She is awaiting further treatment for this condition.         01/27/2024    3:36 PM 09/16/2022    9:51 AM 12/01/2021    3:03 PM 09/25/2020    2:39 PM  Depression screen PHQ 2/9  Decreased Interest 0 0 0 0  Down, Depressed, Hopeless 0 0 0 0  PHQ - 2 Score 0 0 0 0  Altered sleeping 3  3 3   Tired, decreased energy 1  1 1   Change in appetite 0  0 0  Feeling bad or failure about yourself  0  0 0  Trouble  concentrating 0  0 1  Moving slowly or fidgety/restless 0  0 0  Suicidal thoughts 0   0  PHQ-9 Score 4  4 5   Difficult doing work/chores Not difficult at all  Not difficult at all Not difficult at all        01/27/2024    3:38 PM  Fall Risk   Falls in the past year? 0  Number falls in past yr: 0  Injury with Fall? 0  Risk for fall due to : No Fall Risks  Follow up Falls evaluation completed    Patient Care Team: Janece Means, FNP as PCP - General (Family Medicine)   Review of Systems  Constitutional:  Negative for chills, fatigue and fever.  HENT:  Negative for congestion, ear pain and sore throat.   Respiratory:  Negative for cough and shortness of breath.   Cardiovascular:  Negative for chest pain and palpitations.  Gastrointestinal:  Negative for abdominal pain, constipation, diarrhea, nausea and vomiting.  Endocrine: Negative for polydipsia, polyphagia and polyuria.  Genitourinary:  Negative for difficulty urinating and dysuria.  Musculoskeletal:  Negative for arthralgias, back pain and myalgias.  Skin:  Negative for rash.  Neurological:  Negative for headaches.  Psychiatric/Behavioral:  Positive for sleep disturbance. Negative for dysphoric mood. The patient is nervous/anxious.     Current Outpatient Medications on File Prior  to Visit  Medication Sig Dispense Refill   LORazepam  (ATIVAN ) 0.5 MG tablet Take 1 tablet (0.5 mg total) by mouth 2 (two) times daily as needed for anxiety. 30 tablet 0   Multiple Vitamin (MULTIVITAMIN WITH MINERALS) TABS tablet Take 1 tablet by mouth daily.     multivitamin-lutein (OCUVITE-LUTEIN) CAPS capsule Take 1 capsule by mouth daily.     Omega-3 Fatty Acids (FISH OIL) 1200 MG CPDR Take by mouth.     traZODone  (DESYREL ) 50 MG tablet as needed.     No current facility-administered medications on file prior to visit.   Past Medical History:  Diagnosis Date   Anxiety    Arthritis    Cellulitis    Mixed hyperlipidemia    Primary  insomnia    Shingles    Past Surgical History:  Procedure Laterality Date   LAPAROSCOPIC OVARIAN CYSTECTOMY     Left wrist surgery     pin placed   WRIST SURGERY      Family History  Problem Relation Age of Onset   Heart disease Mother    Neuromuscular disorder Father    Breast cancer Sister    Heart attack Brother    Diabetes Brother    Social History   Socioeconomic History   Marital status: Single    Spouse name: Not on file   Number of children: Not on file   Years of education: Not on file   Highest education level: Not on file  Occupational History   Not on file  Tobacco Use   Smoking status: Never   Smokeless tobacco: Never  Vaping Use   Vaping status: Never Used  Substance and Sexual Activity   Alcohol use: No   Drug use: No   Sexual activity: Not on file  Other Topics Concern   Not on file  Social History Narrative   Not on file   Social Drivers of Health   Financial Resource Strain: Low Risk  (03/11/2022)   Overall Financial Resource Strain (CARDIA)    Difficulty of Paying Living Expenses: Not hard at all  Food Insecurity: No Food Insecurity (03/11/2022)   Hunger Vital Sign    Worried About Running Out of Food in the Last Year: Never true    Ran Out of Food in the Last Year: Never true  Transportation Needs: No Transportation Needs (03/11/2022)   PRAPARE - Administrator, Civil Service (Medical): No    Lack of Transportation (Non-Medical): No  Physical Activity: Sufficiently Active (03/11/2022)   Exercise Vital Sign    Days of Exercise per Week: 5 days    Minutes of Exercise per Session: 30 min  Stress: No Stress Concern Present (03/11/2022)   [employer] of Occupational Health - Occupational Stress Questionnaire    Feeling of Stress : Not at all  Social Connections: Moderately Isolated (03/11/2022)   Social Connection and Isolation Panel    Frequency of Communication with Friends and Family: More than three times a week    Frequency of  Social Gatherings with Friends and Family: More than three times a week    Attends Religious Services: More than 4 times per year    Active Member of [employer] or Organizations: No    Attends Engineer, structural: Never    Marital Status: Divorced    Objective:  BP 138/70   Pulse 85   Temp 97.6 F (36.4 C)   Resp 14   Ht 5' 5 (1.651 m)  Wt 157 lb (71.2 kg)   SpO2 98%   BMI 26.13 kg/m      01/27/2024    3:30 PM 09/16/2022    9:50 AM 03/11/2022    2:45 PM  BP/Weight  Systolic BP 138 116 122  Diastolic BP 70 72 78  Wt. (Lbs) 157 166 167  BMI 26.13 kg/m2 27.62 kg/m2 27.37 kg/m2    Physical Exam Vitals reviewed.  Constitutional:      General: She is not in acute distress.    Appearance: Normal appearance.   Eyes:     Conjunctiva/sclera: Conjunctivae normal.    Cardiovascular:     Rate and Rhythm: Normal rate and regular rhythm.     Heart sounds: Normal heart sounds. No murmur heard. Pulmonary:     Effort: Pulmonary effort is normal.     Breath sounds: Normal breath sounds. No wheezing.  Abdominal:     General: Bowel sounds are normal.   Neurological:     Mental Status: She is alert. Mental status is at baseline.   Psychiatric:        Mood and Affect: Mood normal.        Behavior: Behavior normal.      Lab Results  Component Value Date   WBC 9.9 09/16/2022   HGB 13.8 09/16/2022   HCT 39.7 09/16/2022   PLT 396 09/16/2022   GLUCOSE 83 09/16/2022   CHOL 208 (H) 09/16/2022   TRIG 90 09/16/2022   HDL 49 09/16/2022   LDLCALC 143 (H) 09/16/2022   ALT 10 09/16/2022   AST 13 09/16/2022   NA 139 09/16/2022   K 4.5 09/16/2022   CL 102 09/16/2022   CREATININE 0.58 09/16/2022   BUN 17 09/16/2022   CO2 21 09/16/2022   TSH 1.690 09/16/2022      Assessment & Plan:  GAD (generalized anxiety disorder)     No orders of the defined types were placed in this encounter.   No orders of the defined types were placed in this encounter.    Follow-up:  No follow-ups on file.   Gladys Lamp I Leal-Borjas,acting as a scribe for Janece Means, FNP.,have documented all relevant documentation on the behalf of Janece Means, FNP,as directed by  Janece Means, FNP while in the presence of Janece Means, FNP.   An After Visit Summary was printed and given to the patient.  Janece Means, FNP [facility] [phone]

## 2024-01-28 DIAGNOSIS — C44719 Basal cell carcinoma of skin of left lower limb, including hip: Secondary | ICD-10-CM | POA: Insufficient documentation

## 2024-01-28 DIAGNOSIS — E782 Mixed hyperlipidemia: Secondary | ICD-10-CM | POA: Insufficient documentation

## 2024-01-28 DIAGNOSIS — F411 Generalized anxiety disorder: Secondary | ICD-10-CM | POA: Insufficient documentation

## 2024-01-28 DIAGNOSIS — R0781 Pleurodynia: Secondary | ICD-10-CM | POA: Insufficient documentation

## 2024-01-28 LAB — CBC WITH DIFFERENTIAL/PLATELET
Basophils Absolute: 0.1 10*3/uL (ref 0.0–0.2)
Basos: 1 %
EOS (ABSOLUTE): 0.1 10*3/uL (ref 0.0–0.4)
Eos: 1 %
Hematocrit: 40.7 % (ref 34.0–46.6)
Hemoglobin: 13.9 g/dL (ref 11.1–15.9)
Immature Grans (Abs): 0 10*3/uL (ref 0.0–0.1)
Immature Granulocytes: 0 %
Lymphocytes Absolute: 7.1 10*3/uL — ABNORMAL HIGH (ref 0.7–3.1)
Lymphs: 49 %
MCH: 32.9 pg (ref 26.6–33.0)
MCHC: 34.2 g/dL (ref 31.5–35.7)
MCV: 96 fL (ref 79–97)
Monocytes Absolute: 0.9 10*3/uL (ref 0.1–0.9)
Monocytes: 6 %
Neutrophils Absolute: 6 10*3/uL (ref 1.4–7.0)
Neutrophils: 43 %
Platelets: 429 10*3/uL (ref 150–450)
RBC: 4.23 x10E6/uL (ref 3.77–5.28)
RDW: 14.1 % (ref 11.7–15.4)
WBC: 14.2 10*3/uL — ABNORMAL HIGH (ref 3.4–10.8)

## 2024-01-28 LAB — TSH: TSH: 1.69 u[IU]/mL (ref 0.450–4.500)

## 2024-01-28 LAB — LIPID PANEL
Chol/HDL Ratio: 4.9 ratio — ABNORMAL HIGH (ref 0.0–4.4)
Cholesterol, Total: 214 mg/dL — ABNORMAL HIGH (ref 100–199)
HDL: 44 mg/dL (ref >39)
LDL Chol Calc (NIH): 137 mg/dL — ABNORMAL HIGH (ref 0–99)
Triglycerides: 182 mg/dL — ABNORMAL HIGH (ref 0–149)
VLDL Cholesterol Cal: 33 mg/dL (ref 5–40)

## 2024-01-28 LAB — COMPREHENSIVE METABOLIC PANEL WITH GFR
ALT: 12 IU/L (ref 0–32)
AST: 17 IU/L (ref 0–40)
Albumin: 4.4 g/dL (ref 3.9–4.9)
Alkaline Phosphatase: 119 IU/L (ref 44–121)
BUN/Creatinine Ratio: 24 (ref 12–28)
BUN: 22 mg/dL (ref 8–27)
Bilirubin Total: <0.2 mg/dL (ref 0.0–1.2)
CO2: 20 mmol/L (ref 20–29)
Calcium: 9.3 mg/dL (ref 8.7–10.3)
Chloride: 102 mmol/L (ref 96–106)
Creatinine, Ser: 0.92 mg/dL (ref 0.57–1.00)
Globulin, Total: 3.1 g/dL (ref 1.5–4.5)
Glucose: 87 mg/dL (ref 70–99)
Potassium: 4.3 mmol/L (ref 3.5–5.2)
Sodium: 138 mmol/L (ref 134–144)
Total Protein: 7.5 g/dL (ref 6.0–8.5)
eGFR: 68 mL/min/{1.73_m2} (ref >59)

## 2024-01-28 NOTE — Assessment & Plan Note (Signed)
 Well controlled     01/27/2024    3:39 PM 09/16/2022    9:52 AM 12/01/2021    3:05 PM 09/25/2020    6:57 PM  GAD 7 : Generalized Anxiety Score  Nervous, Anxious, on Edge 1 1 1 1   Control/stop worrying 0 0 1 0  Worry too much - different things 0 1 1 0  Trouble relaxing 1 0 1 3  Restless 0 0 0 0  Easily annoyed or irritable 0 0 0 0  Afraid - awful might happen 0 0 0 0  Total GAD 7 Score 2 2 4 4   Anxiety Difficulty Not difficult at all Not difficult at all Not difficult at all Not difficult at all  Chronic insomnia managed with medication as needed. Medication aids relaxation and sleep. Discussed melatonin side effects, advised against use. _ Refill on Ativan  0.5 mg by mouth TWICE A DAY as needed for anxiety and insomnia.

## 2024-01-28 NOTE — Assessment & Plan Note (Deleted)
 Well controlled     01/27/2024    3:39 PM 09/16/2022    9:52 AM 12/01/2021    3:05 PM 09/25/2020    6:57 PM  GAD 7 : Generalized Anxiety Score  Nervous, Anxious, on Edge 1 1 1 1   Control/stop worrying 0 0 1 0  Worry too much - different things 0 1 1 0  Trouble relaxing 1 0 1 3  Restless 0 0 0 0  Easily annoyed or irritable 0 0 0 0  Afraid - awful might happen 0 0 0 0  Total GAD 7 Score 2 2 4 4   Anxiety Difficulty Not difficult at all Not difficult at all Not difficult at all Not difficult at all  Chronic insomnia managed with medication as needed. Medication aids relaxation and sleep. Discussed melatonin side effects, advised against use. _ Refill on Ativan  0.5 mg by mouth TWICE A DAY as needed for anxiety and insomnia.

## 2024-01-28 NOTE — Assessment & Plan Note (Signed)
 Recently diagnosed on the leg, confirmed. Awaiting management plan, possibly Mohs surgery. - Await further management plan, possibly including Mohs surgery.

## 2024-01-28 NOTE — Assessment & Plan Note (Signed)
 Not currently on any medication Lab Results  Component Value Date   LDLCALC 143 (H) 09/16/2022  - Continue to work on diet and exercise  - Labs drawn today, Await labs/testing for assessment and recommendations

## 2024-01-28 NOTE — Assessment & Plan Note (Signed)
 Chronic right-sided rib pain for ten years, no clear cause identified. Onset correlated with shingles vaccination. Anxiety may contribute. Discussed magnesium for relaxation and anxiety. - Consider magnesium supplementation at night to aid relaxation and anxiety.

## 2024-01-31 ENCOUNTER — Ambulatory Visit: Payer: Self-pay | Admitting: Family Medicine
